# Patient Record
Sex: Male | Born: 1970 | Hispanic: No | Marital: Married | Smoking: Never smoker
Health system: Southern US, Community
[De-identification: ages and names within clinical notes are randomized; demographics above are authoritative.]

---

## 2014-09-09 ENCOUNTER — Other Ambulatory Visit (HOSPITAL_COMMUNITY): Payer: Self-pay | Admitting: Nurse Practitioner

## 2014-09-09 DIAGNOSIS — B181 Chronic viral hepatitis B without delta-agent: Secondary | ICD-10-CM

## 2014-09-10 ENCOUNTER — Other Ambulatory Visit (HOSPITAL_COMMUNITY): Payer: Self-pay | Admitting: Nurse Practitioner

## 2014-09-10 DIAGNOSIS — B181 Chronic viral hepatitis B without delta-agent: Secondary | ICD-10-CM

## 2014-09-24 ENCOUNTER — Ambulatory Visit (HOSPITAL_COMMUNITY)
Admission: RE | Admit: 2014-09-24 | Discharge: 2014-09-24 | Disposition: A | Discharge status: Home / Self Care | Payer: Managed Care, Other (non HMO) | Source: Ambulatory Visit | Attending: Nurse Practitioner | Admitting: Nurse Practitioner

## 2014-09-24 DIAGNOSIS — K760 Fatty (change of) liver, not elsewhere classified: Secondary | ICD-10-CM | POA: Diagnosis not present

## 2014-09-24 DIAGNOSIS — R934 Abnormal findings on diagnostic imaging of urinary organs: Secondary | ICD-10-CM | POA: Diagnosis not present

## 2014-09-24 DIAGNOSIS — B181 Chronic viral hepatitis B without delta-agent: Secondary | ICD-10-CM | POA: Diagnosis present

## 2014-09-30 ENCOUNTER — Other Ambulatory Visit (HOSPITAL_COMMUNITY): Payer: Self-pay | Admitting: Nurse Practitioner

## 2014-09-30 DIAGNOSIS — B181 Chronic viral hepatitis B without delta-agent: Secondary | ICD-10-CM

## 2014-10-02 ENCOUNTER — Ambulatory Visit (HOSPITAL_COMMUNITY): Payer: Managed Care, Other (non HMO)

## 2014-10-07 ENCOUNTER — Other Ambulatory Visit: Payer: Self-pay | Admitting: Family Medicine

## 2014-10-07 DIAGNOSIS — N289 Disorder of kidney and ureter, unspecified: Secondary | ICD-10-CM

## 2014-10-15 ENCOUNTER — Ambulatory Visit (HOSPITAL_COMMUNITY): Payer: Self-pay

## 2014-10-16 ENCOUNTER — Ambulatory Visit (HOSPITAL_COMMUNITY): Payer: Managed Care, Other (non HMO)

## 2014-10-29 ENCOUNTER — Ambulatory Visit
Admission: RE | Admit: 2014-10-29 | Discharge: 2014-10-29 | Disposition: A | Discharge status: Home / Self Care | Payer: Managed Care, Other (non HMO) | Source: Ambulatory Visit | Attending: Family Medicine | Admitting: Family Medicine

## 2014-10-29 DIAGNOSIS — N289 Disorder of kidney and ureter, unspecified: Secondary | ICD-10-CM

## 2014-10-29 MED ORDER — GADOBENATE DIMEGLUMINE 529 MG/ML IV SOLN
20.0000 mL | Freq: Once | INTRAVENOUS | Status: AC | PRN
  Administered 2014-10-29: 20 mL via INTRAVENOUS

## 2016-04-12 ENCOUNTER — Other Ambulatory Visit: Payer: Self-pay | Admitting: Family Medicine

## 2016-04-12 DIAGNOSIS — B199 Unspecified viral hepatitis without hepatic coma: Secondary | ICD-10-CM

## 2016-04-12 DIAGNOSIS — B178 Other specified acute viral hepatitis: Secondary | ICD-10-CM

## 2016-04-29 ENCOUNTER — Ambulatory Visit
Admission: RE | Admit: 2016-04-29 | Discharge: 2016-04-29 | Disposition: A | Discharge status: Home / Self Care | Payer: Managed Care, Other (non HMO) | Source: Ambulatory Visit | Attending: Family Medicine | Admitting: Family Medicine

## 2016-04-29 DIAGNOSIS — B178 Other specified acute viral hepatitis: Secondary | ICD-10-CM

## 2016-04-29 DIAGNOSIS — B199 Unspecified viral hepatitis without hepatic coma: Secondary | ICD-10-CM

## 2017-09-13 ENCOUNTER — Ambulatory Visit (HOSPITAL_COMMUNITY)
Admission: RE | Admit: 2017-09-13 | Discharge: 2017-09-13 | Disposition: A | Discharge status: Home / Self Care | Payer: 59 | Attending: Psychiatry | Admitting: Psychiatry

## 2017-09-13 DIAGNOSIS — Z1330 Encounter for screening examination for mental health and behavioral disorders, unspecified: Secondary | ICD-10-CM | POA: Diagnosis not present

## 2017-09-13 DIAGNOSIS — F411 Generalized anxiety disorder: Secondary | ICD-10-CM | POA: Insufficient documentation

## 2017-09-13 DIAGNOSIS — F4310 Post-traumatic stress disorder, unspecified: Secondary | ICD-10-CM | POA: Diagnosis not present

## 2017-09-13 DIAGNOSIS — G4700 Insomnia, unspecified: Secondary | ICD-10-CM | POA: Diagnosis not present

## 2017-09-13 NOTE — H&P (Signed)
Behavioral Health Medical Screening Exam  Mark Green is an 47 y.o. male patient presented to [facility] as walk in; brought by his wife and sent from his primary physician's office; with complaints of worsening anxiety and insomnia.  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  States that he has already set up a appointment with outpatient provider for therapy and psychiatry but his primary physician wanted him to "come her for evaluation."  Patient denies suicidal/homicidal/self-harm ideation, and psychosis.  Does endorse some paranoia about being alone in the dark but state that it is nothing new but the lack of sleep is getting worse which makes everything else worse.  His primary gave him a prescription for Ativan 1 mg and he could take once during the day and one at bedtime if needed.  Patient is able to contract for safety and wife at side also states that she feels that he is safe to go home.      Total Time spent with patient: 45 minutes  Psychiatric Specialty Exam: Physical Exam  Vitals reviewed. Constitutional: He is oriented to person, place, and time. He appears well-developed and well-nourished.  Neck: Normal range of motion.  Respiratory: Effort normal.  Musculoskeletal: Normal range of motion.  Neurological: He is alert and oriented to person, place, and time.  Skin: Skin is warm and dry.  Psychiatric: His speech is normal and behavior is normal. Judgment normal. His mood appears anxious. He is not actively hallucinating. Thought content is not paranoid and not delusional. Cognition and memory are normal. He expresses no homicidal and no suicidal ideation.    Review of Systems  Psychiatric/Behavioral: Negative for depression, hallucinations, memory loss, substance abuse and suicidal ideas. The patient is nervous/anxious and  has insomnia.   All other systems reviewed and are negative.   Blood pressure (!) 146/102, pulse 79, temperature (!) 97.5 F (36.4 C), resp. rate 18, SpO2 100 %.There is no height or weight on file to calculate BMI.  General Appearance: Casual and Neat  Eye Contact:  Good  Speech:  Clear and Coherent and Normal Rate  Volume:  Normal  Mood:  Anxious  Affect:  Appropriate and Congruent  Thought Process:  Coherent and Goal Directed  Orientation:  Full (Time, Place, and Person)  Thought Content:  Logical  Suicidal Thoughts:  No  Homicidal Thoughts:  No  Memory:  Immediate;   Good Recent;   Good Remote;   Good  Judgement:  Intact  Insight:  Present  Psychomotor Activity:  Normal  Concentration: Concentration: Good and Attention Span: Good  Recall:  Good  Fund of Knowledge:Good  Language: Good  Akathisia:  No  Handed:  Right  AIMS (if indicated):     Assets:  Communication Skills Desire for Improvement Financial Resources/Insurance Housing Physical Health Social Support Transportation  Sleep:       Musculoskeletal: Strength & Muscle Tone: within normal limits Gait & Station: normal Patient leans: N/A  Blood pressure (!) 146/102, pulse 79, temperature (!) 97.5 F (36.4 C), resp. rate 18, SpO2 100 %.   During evaluation Mark Green is alert/oriented x 4; calm/cooperative with pleasant affect.  He does not appear to be responding to internal/external stimuli or delusional thoughts.  Patient denies suicidal/self-harm/homicidal ideation, psychosis, and paranoia.  Patient answered question appropriately.  Recommendations:  Keep scheduled appointment with psychiatric outpatient services; Follow instruction of primary provider for prescribed Ativan; Return [facility] or ED if symptoms worsen or if suicidal ideation.  Based on my evaluation the patient does not appear to have an emergency medical condition.  Shuvon Rankin, NP 09/13/2017, 12:38 PM

## 2017-09-13 NOTE — BH Assessment (Signed)
Assessment Note  Mark Green is an 47 y.o. married  male who presents to [facility] for walk-in assessment. Pt was given rx by primary care MD for 1mg  ativan x 15 to get pt through to his 1st appt with psychiatrist, Alanson Aly next week. Pt also has a 2nd appt with therapist, [name] in 2 days. Pt here for assessment to r/o danger to self or others in meantime. Pt denies SI, HI and AVH. He reports no hx of SI or HI. He reports trauma in childhood and that he saw a psychiatrist at 47 yo. Pt remembers testing with psychiatrist and not much f/u at that time. He reports long hx of anxiety. He states he has a phobia of being alone in the dark. Pt reports significant difficulty sleeping x "at least several months". Pt advised to keep his upcoming MH appointments and to return to [facility] if he has any thoughts to harm himself or others.   Diagnosis: F41.1 Generalized Anxiety Disorder; Insomnia  Disposition: Pt to f/u with scheduled Outpt Tx appointments recommended by Assunta Found, NP  Past Medical History: No past medical history on file.    Family History: No family history on file.  Social History:  has no tobacco, alcohol, and drug history on file.  Additional Social History:  Alcohol / Drug Use Pain Medications: n/a Prescriptions: ativan1mg  rx given to pt today Over the Counter: n/a History of alcohol / drug use?: No history of alcohol / drug abuse  CIWA: CIWA-Ar BP: (!) 146/102 Pulse Rate: 79 COWS:    Allergies: Allergies not on file  Home Medications:  (Not in a hospital admission)  OB/GYN Status:  No LMP for male patient.  General Assessment Data Location of Assessment: [facility] Assessment Services TTS Assessment: In system Is this a Tele or Face-to-Face Assessment?: Face-to-Face Is this an Initial Assessment or a Re-assessment for this encounter?: Initial Assessment Marital status: Married Living Arrangements: Spouse/significant other, Children Can pt return to current living  arrangement?: Yes Admission Status: Voluntary Is patient capable of signing voluntary admission?: Yes Referral Source: MD Insurance type: Product/process development scientist Exam [facility] Walk-in ONLY) Medical Exam completed: Yes  Crisis Care Plan Living Arrangements: Spouse/significant other, Children Name of Psychiatrist: Dr. [name](1st appt scheduled for next week) Name of Therapist: Nadine Counts [place](seen 1x)  Education Status Is patient currently in school?: No  Risk to self with the past 6 months Suicidal Ideation: No Has patient been a risk to self within the past 6 months prior to admission? : No Suicidal Intent: No Has patient had any suicidal intent within the past 6 months prior to admission? : No Is patient at risk for suicide?: No Suicidal Plan?: No Has patient had any suicidal plan within the past 6 months prior to admission? : No What has been your use of drugs/alcohol within the last 12 months?: denies Previous Attempts/Gestures: No Other Self Harm Risks: none Triggers for Past Attempts: None known Intentional Self Injurious Behavior: None Family Suicide History: No Recent stressful life event(s): Other (Comment)(childhood trauma) Persecutory voices/beliefs?: No Depression: No(not diagnosed) Depression Symptoms: Insomnia, Feeling angry/irritable, Fatigue, Loss of interest in usual pleasures Substance abuse history and/or treatment for substance abuse?: No Suicide prevention information given to non-admitted patients: Yes  Risk to Others within the past 6 months Homicidal Ideation: No Does patient have any lifetime risk of violence toward others beyond the six months prior to admission? : No Thoughts of Harm to Others: No Current Homicidal Intent: No  Current Homicidal Plan: No History of harm to others?: No Assessment of Violence: None Noted Violent Behavior Description: none known Criminal Charges Pending?: No Does patient have a court date: No Is patient on probation?:  No  Psychosis Hallucinations: None noted Delusions: None noted  Mental Status Report Appearance/Hygiene: Unremarkable Eye Contact: Good Motor Activity: Freedom of movement, Restlessness Speech: Logical/coherent, Pressured, Unremarkable Level of Consciousness: Alert Mood: Anxious Affect: Anxious, Blunted Anxiety Level: Moderate Thought Processes: Coherent, Relevant Judgement: Unimpaired Orientation: Person, Place, Time, Situation  Cognitive Functioning Concentration: Decreased Memory: Recent Intact, Remote Intact IQ: Average Insight: Good Impulse Control: Good Appetite: Fair Weight Loss: (intentionally has lost wt) Sleep: Decreased(been a problem for many months) Total Hours of Sleep: 4  ADLScreening [facility] Assessment [facility]) Patient's cognitive ability adequate to safely complete daily activities?: Yes Patient able to express need for assistance with ADLs?: Yes Independently performs ADLs?: Yes (appropriate for developmental age)  Prior Inpatient Therapy Prior Inpatient Therapy: No  Prior Outpatient Therapy Does patient have an ACCT team?: No Does patient have Intensive In-House Services?  : No Does patient have [facility] services? : No Does patient have P4CC services?: No  ADL Screening (condition at time of admission) Patient's cognitive ability adequate to safely complete daily activities?: Yes Is the patient deaf or have difficulty hearing?: No Does the patient have difficulty seeing, even when wearing glasses/contacts?: No Does the patient have difficulty concentrating, remembering, or making decisions?: No Patient able to express need for assistance with ADLs?: Yes Does the patient have difficulty dressing or bathing?: No Independently performs ADLs?: Yes (appropriate for developmental age) Does the patient have difficulty walking or climbing stairs?: No Weakness of Legs: None Weakness of Arms/Hands: None  Home Assistive Devices/Equipment Home Assistive  Devices/Equipment: Eyeglasses  Therapy Consults (therapy consults require a physician order) PT Evaluation Needed: No OT Evalulation Needed: No SLP Evaluation Needed: No Abuse/Neglect Assessment (Assessment to be complete while patient is alone) Abuse/Neglect Assessment Can Be Completed: Yes Physical Abuse: Denies Verbal Abuse: Yes, past (Comment) Sexual Abuse: Denies Exploitation of patient/patient's resources: Denies Self-Neglect: Denies Values / Beliefs Cultural Requests During Hospitalization: None Spiritual Requests During Hospitalization: None Consults Spiritual Care Consult Needed: No Social Work Consult Needed: No      Additional Information 1:1 In Past 12 Months?: No CIRT Risk: No Elopement Risk: No Does patient have medical clearance?: Yes     Disposition:  Disposition Initial Assessment Completed for this Encounter: Yes Disposition of Patient: Outpatient treatment(Pt has therapist appt 2/22; new psychiatrist appt next week)  On Site Evaluation by:   Reviewed with Physician:    Clearnce Sorreleirdre H Oshea Percival 09/13/2017 12:52 PM

## 2018-04-17 DIAGNOSIS — F411 Generalized anxiety disorder: Secondary | ICD-10-CM | POA: Insufficient documentation

## 2018-04-17 DIAGNOSIS — F429 Obsessive-compulsive disorder, unspecified: Secondary | ICD-10-CM | POA: Insufficient documentation

## 2018-04-17 DIAGNOSIS — G4700 Insomnia, unspecified: Secondary | ICD-10-CM | POA: Insufficient documentation

## 2018-05-04 ENCOUNTER — Encounter: Payer: Self-pay | Admitting: Psychiatry

## 2018-05-04 ENCOUNTER — Encounter: Payer: 59 | Admitting: Psychiatry

## 2018-05-04 NOTE — Progress Notes (Signed)
Note opened in error.  This encounter was created in error - please disregard. 

## 2018-06-08 ENCOUNTER — Other Ambulatory Visit: Payer: Self-pay | Admitting: Psychiatry

## 2018-06-08 NOTE — Telephone Encounter (Signed)
Need to review chart  

## 2018-06-26 ENCOUNTER — Other Ambulatory Visit: Payer: Self-pay

## 2018-08-14 ENCOUNTER — Ambulatory Visit: Payer: 59 | Admitting: Psychiatry

## 2018-08-27 ENCOUNTER — Ambulatory Visit: Payer: 59 | Admitting: Psychiatry

## 2018-08-31 ENCOUNTER — Ambulatory Visit: Payer: 59 | Admitting: Psychiatry

## 2018-08-31 ENCOUNTER — Ambulatory Visit (INDEPENDENT_AMBULATORY_CARE_PROVIDER_SITE_OTHER): Payer: 59 | Admitting: Psychiatry

## 2018-08-31 ENCOUNTER — Encounter: Payer: Self-pay | Admitting: Psychiatry

## 2018-08-31 DIAGNOSIS — F902 Attention-deficit hyperactivity disorder, combined type: Secondary | ICD-10-CM | POA: Diagnosis not present

## 2018-08-31 DIAGNOSIS — F5101 Primary insomnia: Secondary | ICD-10-CM | POA: Diagnosis not present

## 2018-08-31 DIAGNOSIS — F422 Mixed obsessional thoughts and acts: Secondary | ICD-10-CM

## 2018-08-31 MED ORDER — LORAZEPAM 1 MG PO TABS: 1.0000 mg | ORAL_TABLET | Freq: Every evening | ORAL | 1 refills | Status: DC | PRN

## 2018-08-31 MED ORDER — GUANFACINE HCL 1 MG PO TABS: 1.0000 mg | ORAL_TABLET | Freq: Every day | ORAL | 1 refills | Status: DC

## 2018-08-31 MED ORDER — METHYLPHENIDATE HCL ER (OSM) 27 MG PO TBCR: 27.0000 mg | EXTENDED_RELEASE_TABLET | ORAL | 0 refills | Status: DC

## 2018-08-31 NOTE — Progress Notes (Signed)
Mark Green [id number] 1970/12/22 48 y.o.  Subjective:   Patient ID:  Mark Green is a 48 y.o. (DOB 1971/07/21) male.  Chief Complaint:  Chief Complaint  Patient presents with  . Follow-up    Medication Management   Last seen in July 2019 HPI [facility] [name] presents to the office today for follow-up of OCD  He increased fluvoxamine to 300 mg since here and maintained the dosage .  He doesn't think there's been a lot of improvement with the increase in fluvoxamine.  He wants to just cut the dosage back.  Still has OCD and anxiety.  Has had discussions with father.  He thinks he has ADHD but has ignored it bc anxiety was more debilitating.  Thinks it causes anxiety.  Talked with father about it.  F thinks he's had untreated ADHD.  Very impulsive still.  Cuts people off in conversation.  Won't pay attention if not interested.  Impulsivity in decisions trying to move quickly.  Novelty seeking to help motivation.  Reduced attention and impulsivity is longterm.  He thinks Add worsens anxiety which worsens the OCD.  Trying CBT himself which helps.  He doesn't think fluvoxamine does anything really.  He's done a lot of research and wants to use low dose stimulant and low dose non stimulant to help ADD and impuslivity without increaseing the anxiety. Past Psychiatric Medication Trials: Zoloft 2 days with anxiety, paroxetine 60 sexual SE,    Review of Systems:  Review of Systems  Respiratory: Positive for shortness of breath.        SOB long term and runs in the family  Cardiovascular: Negative for chest pain and palpitations.  Neurological: Negative for tremors and weakness.  Psychiatric/Behavioral: Positive for decreased concentration. Negative for agitation, behavioral problems, confusion, dysphoric mood, hallucinations, self-injury, sleep disturbance and suicidal ideas. The patient is nervous/anxious. The patient is not hyperactive.     Medications: I have  reviewed the patient's current medications.  Current Outpatient Medications  Medication Sig Dispense Refill  . diphenhydrAMINE (BENADRYL) 25 MG tablet Take by mouth.    . fluvoxaMINE (LUVOX) 100 MG tablet TAKE 3 TABLETS BY MOUTH EVERY DAY 90 tablet 0  . lisinopril (PRINIVIL,ZESTRIL) 10 MG tablet     . LORazepam (ATIVAN) 1 MG tablet Take 1 tablet (1 mg total) by mouth at bedtime as needed for anxiety. 30 tablet 1  . Multiple Vitamin (MULTIVITAMIN) capsule Take by mouth.    . Omega-3 Fatty Acids (FISH OIL) 1000 MG CAPS Take 2,000 mg by mouth daily.    . simvastatin (ZOCOR) 20 MG tablet     . guanFACINE (TENEX) 1 MG tablet Take 1 tablet (1 mg total) by mouth at bedtime. 30 tablet 1  . methylphenidate (CONCERTA) 27 MG PO CR tablet Take 1 tablet (27 mg total) by mouth every morning. 30 tablet 0   No current facility-administered medications for this visit.     Medication Side Effects: Other: weight gain  Allergies: No Known Allergies  History reviewed. No pertinent past medical history.  Family History  Problem Relation Age of Onset  . OCD Mother   . Anxiety disorder Father     Social History   Socioeconomic History  . Marital status: Married    Spouse name: Not on file  . Number of children: Not on file  . Years of education: Not on file  . Highest education level: Not on file  Occupational History  . Not on file  Social Needs  .  Financial resource strain: Not on file  . Food insecurity:    Worry: Not on file    Inability: Not on file  . Transportation needs:    Medical: Not on file    Non-medical: Not on file  Tobacco Use  . Smoking status: Never Smoker  . Smokeless tobacco: Never Used  Substance and Sexual Activity  . Alcohol use: Not Currently  . Drug use: Never  . Sexual activity: Yes    Partners: Female    Birth control/protection: None  Lifestyle  . Physical activity:    Days per week: Not on file    Minutes per session: Not on file  . Stress: Not on file   Relationships  . Social connections:    Talks on phone: Not on file    Gets together: Not on file    Attends religious service: Not on file    Active member of club or organization: Not on file    Attends meetings of clubs or organizations: Not on file    Relationship status: Not on file  . Intimate partner violence:    Fear of current or ex partner: Not on file    Emotionally abused: Not on file    Physically abused: Not on file    Forced sexual activity: Not on file  Other Topics Concern  . Not on file  Social History Narrative  . Not on file    Past Medical History, Surgical history, Social history, and Family history were reviewed and updated as appropriate.   Adult Self Report Scale Inattention=24 and hyperactivity=30  Total 54 which is very high  Please see review of systems for further details on the patient's review from today.   Objective:   Physical Exam:  There were no vitals taken for this visit.  Physical Exam Constitutional:      General: He is not in acute distress.    Appearance: He is well-developed.  Musculoskeletal:        General: No deformity.  Neurological:     Mental Status: He is alert and oriented to person, place, and time.     Motor: No tremor.     Coordination: Coordination normal.     Gait: Gait normal.  Psychiatric:        Attention and Perception: Perception normal. He is inattentive.        Mood and Affect: Mood is anxious. Mood is not depressed. Affect is not labile, blunt, angry or inappropriate.        Speech: Speech normal.        Behavior: Behavior normal.        Thought Content: Thought content normal. Thought content does not include homicidal or suicidal ideation. Thought content does not include homicidal or suicidal plan.        Cognition and Memory: Cognition normal.        Judgment: Judgment normal.     Comments: Insight is good. Residual OCD unchanged.     Lab Review:  No results found for: NA, K, CL, CO2, GLUCOSE,  BUN, CREATININE, CALCIUM, PROT, ALBUMIN, AST, ALT, ALKPHOS, BILITOT, GFRNONAA, GFRAA  No results found for: WBC, RBC, HGB, HCT, PLT, MCV, MCH, MCHC, RDW, LYMPHSABS, MONOABS, EOSABS, BASOSABS  No results found for: POCLITH, LITHIUM   No results found for: PHENYTOIN, PHENOBARB, VALPROATE, CBMZ   .res Assessment: Plan:    Attention deficit hyperactivity disorder (ADHD), combined type - Plan: guanFACINE (TENEX) 1 MG tablet, methylphenidate (CONCERTA) 27 MG PO CR  tablet  Mixed obsessional thoughts and acts  Primary insomnia   I accept  the patient's self-assessment that he has significant untreated ADHD combined type.  His hyperactivity score on the adult self-report scale was much higher than what is typically seen with adult patients.  Therefore it is reasonable to approach it as he suggests with low-dose stimulant plus guanfacine.  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  He brought in a paper about the comorbidity between ADHD and OCD.  We discussed this.  We also discussed that stimulants can worsen anxiety.  We will proceed with a low-dose.  We discussed the different types of stimulants and durations and formulations and side effects in detail.  Plan reduce fluvoxamine to 200 mg and consider change to fluoxetine or clomipramine to get better control the OCD.  I strongly encouraged trials of other SSRIs to better control his OCD and anxiety overall.  He thinks improving his ADD symptoms will improve his anxiety.  Per his request OK trial stimulant with low dosage guanfacine.  Concerta 27 mg and guanfacine 0.5 mg for 1 week and then 1 mg nightly. Discussed potential benefits, risks, and side effects of stimulants with patient to include increased heart rate, palpitations, insomnia, increased anxiety, increased irritability, or decreased appetite.  Instructed patient to contact office if experiencing any significant tolerability issues.  This is a 35-minute  appointment  Follow-up 6 to 8 weeks  Meredith Staggersarey Cottle, MD, DFAPA  Please see After Visit Summary for patient specific instructions.  Future Appointments  Date Time Provider Department Center  10/19/2018  9:30 AM Cottle, Steva Readyarey G Jr., MD [facility] None    No orders of the defined types were placed in this encounter.     -------------------------------

## 2018-09-18 ENCOUNTER — Other Ambulatory Visit: Payer: Self-pay | Admitting: Psychiatry

## 2018-09-19 ENCOUNTER — Other Ambulatory Visit: Payer: Self-pay

## 2018-09-19 MED ORDER — FLUVOXAMINE MALEATE 100 MG PO TABS: ORAL_TABLET | ORAL | 0 refills | Status: DC

## 2018-09-22 ENCOUNTER — Other Ambulatory Visit: Payer: Self-pay | Admitting: Psychiatry

## 2018-09-22 DIAGNOSIS — F902 Attention-deficit hyperactivity disorder, combined type: Secondary | ICD-10-CM

## 2018-10-18 ENCOUNTER — Other Ambulatory Visit: Payer: Self-pay | Admitting: Psychiatry

## 2018-10-18 DIAGNOSIS — F902 Attention-deficit hyperactivity disorder, combined type: Secondary | ICD-10-CM

## 2018-10-18 NOTE — Telephone Encounter (Signed)
Has office visit tomorrow

## 2018-10-19 ENCOUNTER — Other Ambulatory Visit: Payer: Self-pay

## 2018-10-19 ENCOUNTER — Ambulatory Visit (INDEPENDENT_AMBULATORY_CARE_PROVIDER_SITE_OTHER): Payer: 59 | Admitting: Psychiatry

## 2018-10-19 ENCOUNTER — Encounter: Payer: Self-pay | Admitting: Psychiatry

## 2018-10-19 DIAGNOSIS — F5101 Primary insomnia: Secondary | ICD-10-CM

## 2018-10-19 DIAGNOSIS — F902 Attention-deficit hyperactivity disorder, combined type: Secondary | ICD-10-CM | POA: Diagnosis not present

## 2018-10-19 DIAGNOSIS — F422 Mixed obsessional thoughts and acts: Secondary | ICD-10-CM

## 2018-10-19 MED ORDER — ALPRAZOLAM 0.5 MG PO TABS: ORAL_TABLET | ORAL | 1 refills | Status: DC

## 2018-10-19 MED ORDER — METHYLPHENIDATE HCL ER (OSM) 36 MG PO TBCR: 36.0000 mg | EXTENDED_RELEASE_TABLET | Freq: Every day | ORAL | 0 refills | Status: DC

## 2018-10-19 MED ORDER — GUANFACINE HCL 1 MG PO TABS: ORAL_TABLET | ORAL | 0 refills | Status: DC

## 2018-10-19 MED ORDER — GUANFACINE HCL 1 MG PO TABS: ORAL_TABLET | ORAL | 1 refills | Status: DC

## 2018-10-19 NOTE — Telephone Encounter (Signed)
rx needs to be 90 day for insurance to cover guanfacine 1mg  submitted for #[id number]

## 2018-10-19 NOTE — Progress Notes (Addendum)
Mark Green [id number] 1971/05/03 48 y.o.  Subjective:   Patient ID:  Mark Green is a 48 y.o. (DOB [birth year]) male.  Chief Complaint:  Chief Complaint  Patient presents with  . ADHD  . Anxiety    OCD  . Follow-up    med changes  . Sleeping Problem    HPI [facility] [name] presents to the office today for follow-up of OCD and ADD.  He was last seen August 31, 2018 his OCD and ADD symptoms were not under as good control as he would like.  His preference was to focus on the ADD symptoms and he started Concerta 27 mg every morning and guanfacine 0.5 mg for 1 week to increase to 1 mg nightly.  Also reduced the fluvoxamine to .  Well, but a little more anxiety but under more stress with Covid and working from home.  Concerta inititally better focus.  Maybe a little less now re: benefit.  Doesn't think it increased anxiety.  Skipped for 2-3 days but felt no withdrawal.  Only taking Ativan at hs.  Some sleep problems and wonders about switch back to alprazolam..  Disc his questions.   He increased fluvoxamine to 300 mg since here and maintained the dosage .  He doesn't think there's been a lot of improvement with the increase in fluvoxamine.  He wants to just cut the dosage back.  Still has OCD and anxiety.  Has had discussions with father.  He thinks he has ADHD but has ignored it bc anxiety was more debilitating.  Thinks it causes anxiety.  Talked with father about it.  F thinks he's had untreated ADHD.  Very impulsive still.  Cuts people off in conversation.  Won't pay attention if not interested.  Impulsivity in decisions trying to move quickly.  Novelty seeking to help motivation.  Reduced attention and impulsivity is longterm.  He thinks Add worsens anxiety which worsens the OCD.  Trying CBT himself which helps.  He doesn't think fluvoxamine does anything really.  He's done a lot of research and wants to use low dose stimulant and low dose non stimulant to  help ADD and impuslivity without increaseing the anxiety. Past Psychiatric Medication Trials: Zoloft 2 days with anxiety, paroxetine 60 sexual SE, trazodone.   Review of Systems:  Review of Systems  Respiratory: Negative for shortness of breath.        SOB long term and runs in the family  Cardiovascular: Negative for chest pain and palpitations.  Neurological: Negative for tremors and weakness.  Psychiatric/Behavioral: Positive for decreased concentration. Negative for agitation, behavioral problems, confusion, dysphoric mood, hallucinations, self-injury, sleep disturbance and suicidal ideas. The patient is nervous/anxious. The patient is not hyperactive.     Medications: I have reviewed the patient's current medications.  Current Outpatient Medications  Medication Sig Dispense Refill  . ALPRAZolam (XANAX) 0.5 MG tablet 1 to 1-1/2 tablets each night as needed for sleep 45 tablet 1  . diphenhydrAMINE (BENADRYL) 25 MG tablet Take by mouth.    . fluvoxaMINE (LUVOX) 100 MG tablet Take 2 tablets (200 mg total) by mouth daily 180 tablet 0  . guanFACINE (TENEX) 1 MG tablet 2 tablets each night for 1 week then 3 tablets at night 270 tablet 0  . lisinopril (PRINIVIL,ZESTRIL) 10 MG tablet     . methylphenidate (CONCERTA) 36 MG PO CR tablet Take 1 tablet (36 mg total) by mouth daily. 30 tablet 0  . Multiple Vitamin (MULTIVITAMIN) capsule Take by mouth.    Marland Kitchen  Omega-3 Fatty Acids (FISH OIL) 1000 MG CAPS Take 2,000 mg by mouth daily.    . simvastatin (ZOCOR) 20 MG tablet      No current facility-administered medications for this visit.     Medication Side Effects: Other: weight gain  Allergies: No Known Allergies  History reviewed. No pertinent past medical history.  Family History  Problem Relation Age of Onset  . OCD Mother   . Anxiety disorder Father     Social History   Socioeconomic History  . Marital status: Married    Spouse name: Not on file  . Number of children: Not on  file  . Years of education: Not on file  . Highest education level: Not on file  Occupational History  . Not on file  Social Needs  . Financial resource strain: Not on file  . Food insecurity:    Worry: Not on file    Inability: Not on file  . Transportation needs:    Medical: Not on file    Non-medical: Not on file  Tobacco Use  . Smoking status: Never Smoker  . Smokeless tobacco: Never Used  Substance and Sexual Activity  . Alcohol use: Not Currently  . Drug use: Never  . Sexual activity: Yes    Partners: Female    Birth control/protection: None  Lifestyle  . Physical activity:    Days per week: Not on file    Minutes per session: Not on file  . Stress: Not on file  Relationships  . Social connections:    Talks on phone: Not on file    Gets together: Not on file    Attends religious service: Not on file    Active member of club or organization: Not on file    Attends meetings of clubs or organizations: Not on file    Relationship status: Not on file  . Intimate partner violence:    Fear of current or ex partner: Not on file    Emotionally abused: Not on file    Physically abused: Not on file    Forced sexual activity: Not on file  Other Topics Concern  . Not on file  Social History Narrative  . Not on file    Past Medical History, Surgical history, Social history, and Family history were reviewed and updated as appropriate.   Adult Self Report Scale Inattention=24 and hyperactivity=30  Total 54 which is very high  Please see review of systems for further details on the patient's review from today.   Objective:   Physical Exam:  There were no vitals taken for this visit.  Physical Exam Constitutional:      General: He is not in acute distress.    Appearance: He is well-developed.  Musculoskeletal:        General: No deformity.  Neurological:     Mental Status: He is alert and oriented to person, place, and time.  Psychiatric:        Attention and  Perception: Perception normal. He is inattentive.        Mood and Affect: Mood is anxious. Mood is not depressed. Affect is not labile, blunt, angry or inappropriate.        Speech: Speech normal.        Behavior: Behavior normal.        Thought Content: Thought content normal. Thought content does not include homicidal or suicidal ideation.        Cognition and Memory: Cognition normal.  Judgment: Judgment normal.     Comments: Insight is good. Residual OCD unchanged.     Lab Review:  No results found for: NA, K, CL, CO2, GLUCOSE, BUN, CREATININE, CALCIUM, PROT, ALBUMIN, AST, ALT, ALKPHOS, BILITOT, GFRNONAA, GFRAA  No results found for: WBC, RBC, HGB, HCT, PLT, MCV, MCH, MCHC, RDW, LYMPHSABS, MONOABS, EOSABS, BASOSABS  No results found for: POCLITH, LITHIUM   No results found for: PHENYTOIN, PHENOBARB, VALPROATE, CBMZ   .res Assessment: Plan:    Attention deficit hyperactivity disorder (ADHD), combined type  Mixed obsessional thoughts and acts  Primary insomnia   I accept  the patient's self-assessment that he has significant untreated ADHD combined type.  His hyperactivity score on the adult self-report scale was much higher than what is typically seen with adult patients.  Therefore it is reasonable to approach it as he suggests with low-dose stimulant plus guanfacine.  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  .  We also discussed that stimulants can worsen anxiety.  We will proceed with a low-dose.  We discussed the different types of stimulants and durations and formulations and side effects in detail.  Cconsider change to fluoxetine or clomipramine to get better control the OCD.  I strongly encouraged trials of other SSRIs to better control his OCD and anxiety overall.  But I'm hesitant to make so many med changes at one time. He thinks improving his ADD symptoms will improve his anxiety.  Per his request OK trial stimulant with low dosage guanfacine.   Concerta increase to 36 mg and guanfacine to 2 mg and then 3 mg nightly.  Discussed potential benefits, risks, and side effects of stimulants with patient to include increased heart rate, palpitations, insomnia, increased anxiety, increased irritability, or decreased appetite.  Instructed patient to contact office if experiencing any significant tolerability issues.  OK switch back to Xanax for sleep instead of Ativan. We discussed the short-term risks associated with benzodiazepines including sedation and increased fall risk among others.  Discussed long-term side effect risk including dependence, potential withdrawal symptoms, and the potential eventual dose-related risk of dementia.  This is a 35-minute appointment  I connected with patient by a video enabled telemedicine application or telephone, with their informed consent, and verified patient privacy and that I am speaking with the correct person using two identifiers.  I was located at office and patient at home.  Follow-up 6 weeks  Meredith Staggers, MD, DFAPA  Please see After Visit Summary for patient specific instructions.  No future appointments.  No orders of the defined types were placed in this encounter.     -------------------------------

## 2018-11-15 NOTE — Progress Notes (Signed)
Thanks. I did so. Meredith Staggers

## 2018-12-18 ENCOUNTER — Other Ambulatory Visit: Payer: Self-pay | Admitting: Psychiatry

## 2019-01-11 ENCOUNTER — Other Ambulatory Visit: Payer: Self-pay | Admitting: Psychiatry

## 2019-01-18 ENCOUNTER — Encounter: Payer: Self-pay | Admitting: Psychiatry

## 2019-01-18 ENCOUNTER — Other Ambulatory Visit: Payer: Self-pay

## 2019-01-18 ENCOUNTER — Ambulatory Visit (INDEPENDENT_AMBULATORY_CARE_PROVIDER_SITE_OTHER): Payer: 59 | Admitting: Psychiatry

## 2019-01-18 DIAGNOSIS — F5101 Primary insomnia: Secondary | ICD-10-CM | POA: Diagnosis not present

## 2019-01-18 DIAGNOSIS — F422 Mixed obsessional thoughts and acts: Secondary | ICD-10-CM

## 2019-01-18 DIAGNOSIS — F902 Attention-deficit hyperactivity disorder, combined type: Secondary | ICD-10-CM | POA: Diagnosis not present

## 2019-01-18 MED ORDER — GUANFACINE HCL 1 MG PO TABS: ORAL_TABLET | ORAL | 0 refills | Status: DC

## 2019-01-18 MED ORDER — METHYLPHENIDATE HCL ER (OSM) 36 MG PO TBCR: 36.0000 mg | EXTENDED_RELEASE_TABLET | Freq: Every day | ORAL | 0 refills | Status: DC

## 2019-01-18 NOTE — Progress Notes (Signed)
Mark Green [id number] 1970/07/27 48 y.o.  Subjective:   Patient ID:  Mark Green is a 48 y.o. (DOB [birth year]) male.  Chief Complaint:  Chief Complaint  Patient presents with  . Follow-up    Med changes  . ADHD  . Sleeping Problem  . Obsessive-compulsive disorder    HPI [facility] Mark Green presents to the office today for follow-up of OCD and ADD and insomnia.  Last visit was October 19, 2018.  Concerta was increased to 36 mg and guanfacine was increased to 2 and then 3 mg.  Also for sleep Ativan was switched back to Xanax because he felt it worked better for sleep.  .Some improvement from Guanfacine to 2 mg .  Concerta helped some benefits with better focus and then ran out and didn't renew bc fears about it being a controlled substance.  No history of abuse.  Was surprised he felt less stressed on it also.    He does a lot of online research on the Internet for things.  Questions about NAC for Covid and focus and OCD.  Thinks since starting it has helped.  Extensive discussion of NAC in cognitive issues and OC sx.   At visit August 31, 2018 his OCD and ADD symptoms were not under as good control as he would like.  His preference was to focus on the ADD symptoms and he started Concerta 27 mg every morning and guanfacine 0.5 mg for 1 week to increase to 1 mg nightly.  Also reduced the fluvoxamine to 200mg .  He's done a lot of research and wants to use low dose stimulant and low dose non stimulant to help ADD and impuslivity without increaseing the anxiety.  This has been partially beneficial.  Patient reports stable mood and denies depressed or irritable moods.    Patient reports some difficulty with sleep initiation or maintenance but better with guanfacine. Denies appetite disturbance.  Patient reports that energy and motivation have been good. Patient denies any suicidal ideation.   Past Psychiatric Medication Trials: Zoloft 2 days with anxiety, paroxetine 60  sexual SE, trazodone.   Review of Systems:  Review of Systems  Respiratory: Negative for shortness of breath.        SOB long term and runs in the family  Cardiovascular: Negative for chest pain and palpitations.  Neurological: Negative for tremors and weakness.  Psychiatric/Behavioral: Positive for decreased concentration. Negative for agitation, behavioral problems, confusion, dysphoric mood, hallucinations, self-injury, sleep disturbance and suicidal ideas. The patient is nervous/anxious. The patient is not hyperactive.     Medications: I have reviewed the patient's current medications.  Current Outpatient Medications  Medication Sig Dispense Refill  . ALPRAZolam (XANAX) 0.5 MG tablet 1 to 1-1/2 tablets each night as needed for sleep 45 tablet 1  . diphenhydrAMINE (BENADRYL) 25 MG tablet Take by mouth.    . fluvoxaMINE (LUVOX) 100 MG tablet TAKE 2 TABLETS BY MOUTH EVERY DAY 180 tablet 0  . guanFACINE (TENEX) 1 MG tablet TAKE 3 TABLETS NIGHTLY 270 tablet 0  . Multiple Vitamin (MULTIVITAMIN) capsule Take by mouth.    . Omega-3 Fatty Acids (FISH OIL) 1000 MG CAPS Take 2,000 mg by mouth daily.    . simvastatin (ZOCOR) 20 MG tablet     . lisinopril (PRINIVIL,ZESTRIL) 10 MG tablet     . methylphenidate (CONCERTA) 36 MG PO CR tablet Take 1 tablet (36 mg total) by mouth daily. 30 tablet 0  . [START ON 02/15/2019] methylphenidate (CONCERTA) 36 MG  PO CR tablet Take 1 tablet (36 mg total) by mouth daily. 30 tablet 0   No current facility-administered medications for this visit.     Medication Side Effects: Other: weight gain  Allergies: No Known Allergies  No past medical history on file.  Family History  Problem Relation Age of Onset  . OCD Mother   . Anxiety disorder Father     Social History   Socioeconomic History  . Marital status: Married    Spouse name: Not on file  . Number of children: Not on file  . Years of education: Not on file  . Highest education level: Not on  file  Occupational History  . Not on file  Social Needs  . Financial resource strain: Not on file  . Food insecurity    Worry: Not on file    Inability: Not on file  . Transportation needs    Medical: Not on file    Non-medical: Not on file  Tobacco Use  . Smoking status: Never Smoker  . Smokeless tobacco: Never Used  Substance and Sexual Activity  . Alcohol use: Not Currently  . Drug use: Never  . Sexual activity: Yes    Partners: Female    Birth control/protection: None  Lifestyle  . Physical activity    Days per week: Not on file    Minutes per session: Not on file  . Stress: Not on file  Relationships  . Social Musicianconnections    Talks on phone: Not on file    Gets together: Not on file    Attends religious service: Not on file    Active member of club or organization: Not on file    Attends meetings of clubs or organizations: Not on file    Relationship status: Not on file  . Intimate partner violence    Fear of current or ex partner: Not on file    Emotionally abused: Not on file    Physically abused: Not on file    Forced sexual activity: Not on file  Other Topics Concern  . Not on file  Social History Narrative  . Not on file    Past Medical History, Surgical history, Social history, and Family history were reviewed and updated as appropriate.   Adult Self Report Scale Inattention=24 and hyperactivity=30  Total 54 which is very high  Please see review of systems for further details on the patient's review from today.   Objective:   Physical Exam:  There were no vitals taken for this visit.  Physical Exam Constitutional:      General: He is not in acute distress.    Appearance: He is well-developed.  Musculoskeletal:        General: No deformity.  Neurological:     Mental Status: He is alert and oriented to person, place, and time.  Psychiatric:        Attention and Perception: Perception normal. He is inattentive.        Mood and Affect: Mood is  anxious. Mood is not depressed. Affect is not labile, blunt, angry or inappropriate.        Speech: Speech normal.        Behavior: Behavior normal.        Thought Content: Thought content normal. Thought content does not include homicidal or suicidal ideation.        Cognition and Memory: Cognition normal.        Judgment: Judgment normal.     Comments: Insight  is good. Residual OCD unchanged.     Lab Review:  No results found for: NA, K, CL, CO2, GLUCOSE, BUN, CREATININE, CALCIUM, PROT, ALBUMIN, AST, ALT, ALKPHOS, BILITOT, GFRNONAA, GFRAA  No results found for: WBC, RBC, HGB, HCT, PLT, MCV, MCH, MCHC, RDW, LYMPHSABS, MONOABS, EOSABS, BASOSABS  No results found for: POCLITH, LITHIUM   No results found for: PHENYTOIN, PHENOBARB, VALPROATE, CBMZ   .res Assessment: Plan:    Marnette Burgessziz was seen today for follow-up, adhd, sleeping problem and obsessive-compulsive disorder.  Diagnoses and all orders for this visit:  Mixed obsessional thoughts and acts  Attention deficit hyperactivity disorder (ADHD), combined type -     methylphenidate (CONCERTA) 36 MG PO CR tablet; Take 1 tablet (36 mg total) by mouth daily. -     guanFACINE (TENEX) 1 MG tablet; TAKE 3 TABLETS NIGHTLY -     methylphenidate (CONCERTA) 36 MG PO CR tablet; Take 1 tablet (36 mg total) by mouth daily.  Primary insomnia     I accept  the patient's self-assessment that he has significant untreated ADHD combined type.  His hyperactivity score on the adult self-report scale was much higher than what is typically seen with adult patients.  Therefore it is reasonable to approach it as he suggests with low-dose stimulant plus guanfacine.  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  .  We also discussed that stimulants can worsen anxiety.  We will proceed with a low-dose.  We discussed the different types of stimulants and durations and formulations and side effects in detail.  Cconsider change to fluoxetine or  clomipramine to get better control the OCD.  He feels the NAC 2000mg  daily has helped improve the OCD.  Consider trials of other SSRIs to better control his OCD and anxiety overall.  But I'm hesitant to make so many med changes at one time. He thinks improving his ADD symptoms will improve his anxiety.  Disc of pros and cons to restarting Concerta.  He wants to give it another try.  Per his request OK trial stimulant with low dosage guanfacine.  Concerta increase to 36 mg and guanfacine to 3 mg nightly.  Discussed potential benefits, risks, and side effects of stimulants with patient to include increased heart rate, palpitations, insomnia, increased anxiety, increased irritability, or decreased appetite.  Instructed patient to contact office if experiencing any significant tolerability issues.  OK continue Xanax prn for sleep instead of Ativan. We discussed the short-term risks associated with benzodiazepines including sedation and increased fall risk among others.  Discussed long-term side effect risk including dependence, potential withdrawal symptoms, and the potential eventual dose-related risk of dementia.  Answered questions and given some reassurance about dosage he's taking and what makes meds a controlled substance including the stimulant and the BZ.    Extensive discusssion of NAC off label for various conditions.    This is a 35-minute appointment  Follow-up 6-8 weeks  Meredith Staggersarey Cottle, MD, DFAPA  Please see After Visit Summary for patient specific instructions.  Future Appointments  Date Time Provider Department Center  03/29/2019  9:30 AM Cottle, Steva Readyarey G Jr., MD [facility] None    No orders of the defined types were placed in this encounter.     -------------------------------

## 2019-02-22 ENCOUNTER — Other Ambulatory Visit: Payer: Self-pay

## 2019-02-22 MED ORDER — ALPRAZOLAM 0.5 MG PO TABS: ORAL_TABLET | ORAL | 1 refills | Status: DC

## 2019-02-22 NOTE — Telephone Encounter (Signed)
rx accidentally printed but it was called into his pharmacy

## 2019-03-12 ENCOUNTER — Other Ambulatory Visit: Payer: Self-pay | Admitting: Psychiatry

## 2019-03-29 ENCOUNTER — Ambulatory Visit: Payer: 59 | Admitting: Psychiatry

## 2019-04-23 ENCOUNTER — Encounter: Payer: Self-pay | Admitting: Psychiatry

## 2019-04-23 ENCOUNTER — Ambulatory Visit (INDEPENDENT_AMBULATORY_CARE_PROVIDER_SITE_OTHER): Payer: 59 | Admitting: Psychiatry

## 2019-04-23 ENCOUNTER — Other Ambulatory Visit: Payer: Self-pay

## 2019-04-23 DIAGNOSIS — F902 Attention-deficit hyperactivity disorder, combined type: Secondary | ICD-10-CM

## 2019-04-23 DIAGNOSIS — F5101 Primary insomnia: Secondary | ICD-10-CM | POA: Diagnosis not present

## 2019-04-23 DIAGNOSIS — F422 Mixed obsessional thoughts and acts: Secondary | ICD-10-CM

## 2019-04-23 NOTE — Progress Notes (Signed)
Mark Green [id number] 12-Sep-1970 48 y.o.  Subjective:   Patient ID:  Mark Green is a 48 y.o. (DOB 12-Sep-1970) male.  Chief Complaint:  Chief Complaint  Patient presents with  . Medication Problem  . ADD  . Sleeping Problem    HPI Mark Green presents to the office today for follow-up of OCD and ADD and insomnia.  Last visit increased Concerta to 36 am and Guanfacine to 3mg .  But that made him sleepy.  He flet even the Concerta alone made  Him lethargic.  Lost benefit anyway. So stopped it.    Concern over the antidepressant potentially elevating glucose and stopped it 2-3 weeks ago.  Weaned himself off of it.  Doesn't think it did that much anyway.  No problems with it so far.  Changed Xanax for sleep instead of Ativan but usually uses about 1 1/2 tablets weekly.  Overall sleep is better.  Less work [employer] and feels more relaxed.  Feels more relaxed with his rest.  Covid has been great for me.  Thinks he over stresses himself.  Not working 1 week on an 1 week off probably until January.  Willing to take fluvoxamine again if the anxiety gets worst.  OCD sx are greatly reduced.  Overall   He does a lot of online research on the Internet for things.  Questions about NAC for Covid and focus and OCD.  Thinks since starting it has helped.  Extensive discussion of NAC in cognitive issues and OC sx.   At visit August 31, 2018 his OCD and ADD symptoms were not under as good control as he would like.  His preference was to focus on the ADD symptoms and he started Concerta 27 mg every morning and guanfacine 0.5 mg for 1 week to increase to 1 mg nightly.  Also reduced the fluvoxamine to 200mg .  He's done a lot of research and wants to use low dose stimulant and low dose non stimulant to help ADD and impuslivity without increaseing the anxiety.  This has been partially beneficial.  Patient reports stable mood and denies depressed or irritable moods.    Patient  reports some difficulty with sleep initiation or maintenance but better with guanfacine. Denies appetite disturbance.  Patient reports that energy and motivation have been good. Patient denies any suicidal ideation.   Past Psychiatric Medication Trials: Zoloft 2 days with anxiety, paroxetine 60 sexual SE, trazodone.   Review of Systems:  Review of Systems  Respiratory: Negative for shortness of breath.        SOB long term and runs in the family  Cardiovascular: Negative for chest pain and palpitations.  Neurological: Negative for tremors and weakness.  Psychiatric/Behavioral: Negative for agitation, behavioral problems, confusion, decreased concentration, dysphoric mood, hallucinations, self-injury, sleep disturbance and suicidal ideas. The patient is not nervous/anxious and is not hyperactive.     Medications: I have reviewed the patient's current medications.  Current Outpatient Medications  Medication Sig Dispense Refill  . ALPRAZolam (XANAX) 0.5 MG tablet 1 to 1-1/2 tablets each night as needed for sleep 45 tablet 1  . diphenhydrAMINE (BENADRYL) 25 MG tablet Take by mouth.    Marland Kitchen. lisinopril (PRINIVIL,ZESTRIL) 10 MG tablet     . Multiple Vitamin (MULTIVITAMIN) capsule Take by mouth.    . Omega-3 Fatty Acids (FISH OIL) 1000 MG CAPS Take 2,000 mg by mouth daily.    . simvastatin (ZOCOR) 20 MG tablet     . fluvoxaMINE (LUVOX) 100 MG  tablet TAKE 2 TABLETS BY MOUTH EVERY DAY (Patient not taking: Reported on 04/23/2019) 180 tablet 0  . guanFACINE (TENEX) 1 MG tablet TAKE 3 TABLETS NIGHTLY (Patient not taking: Reported on 04/23/2019) 270 tablet 0  . methylphenidate (CONCERTA) 36 MG PO CR tablet Take 1 tablet (36 mg total) by mouth daily. (Patient not taking: Reported on 04/23/2019) 30 tablet 0  . methylphenidate (CONCERTA) 36 MG PO CR tablet Take 1 tablet (36 mg total) by mouth daily. (Patient not taking: Reported on 04/23/2019) 30 tablet 0   No current facility-administered medications for  this visit.     Medication Side Effects: Other: weight gain  Allergies: No Known Allergies  History reviewed. No pertinent past medical history.  Family History  Problem Relation Age of Onset  . OCD Mother   . Anxiety disorder Father     Social History   Socioeconomic History  . Marital status: Married    Spouse name: Not on file  . Number of children: Not on file  . Years of education: Not on file  . Highest education level: Not on file  Occupational History  . Not on file  Social Needs  . Financial resource strain: Not on file  . Food insecurity    Worry: Not on file    Inability: Not on file  . Transportation needs    Medical: Not on file    Non-medical: Not on file  Tobacco Use  . Smoking status: Never Smoker  . Smokeless tobacco: Never Used  Substance and Sexual Activity  . Alcohol use: Not Currently  . Drug use: Never  . Sexual activity: Yes    Partners: Female    Birth control/protection: None  Lifestyle  . Physical activity    Days per week: Not on file    Minutes per session: Not on file  . Stress: Not on file  Relationships  . Social Herbalist on phone: Not on file    Gets together: Not on file    Attends religious service: Not on file    Active member of club or organization: Not on file    Attends meetings of clubs or organizations: Not on file    Relationship status: Not on file  . Intimate partner violence    Fear of current or ex partner: Not on file    Emotionally abused: Not on file    Physically abused: Not on file    Forced sexual activity: Not on file  Other Topics Concern  . Not on file  Social History Narrative  . Not on file    Past Medical History, Surgical history, Social history, and Family history were reviewed and updated as appropriate.   Adult Self Report Scale Inattention=24 and hyperactivity=30  Total 54 which is very high  Please see review of systems for further details on the patient's review from  today.   Objective:   Physical Exam:  There were no vitals taken for this visit.  Physical Exam Constitutional:      General: He is not in acute distress.    Appearance: He is well-developed.  Musculoskeletal:        General: No deformity.  Neurological:     Mental Status: He is alert and oriented to person, place, and time.  Psychiatric:        Attention and Perception: Perception normal. He is attentive.        Mood and Affect: Mood is not anxious or depressed. Affect  is not labile, blunt, angry or inappropriate.        Speech: Speech normal.        Behavior: Behavior normal.        Thought Content: Thought content normal. Thought content does not include homicidal or suicidal ideation.        Cognition and Memory: Cognition normal.        Judgment: Judgment normal.     Comments: Insight is good. Residual OCD unchanged.     Lab Review:  No results found for: NA, K, CL, CO2, GLUCOSE, BUN, CREATININE, CALCIUM, PROT, ALBUMIN, AST, ALT, ALKPHOS, BILITOT, GFRNONAA, GFRAA  No results found for: WBC, RBC, HGB, HCT, PLT, MCV, MCH, MCHC, RDW, LYMPHSABS, MONOABS, EOSABS, BASOSABS  No results found for: POCLITH, LITHIUM   No results found for: PHENYTOIN, PHENOBARB, VALPROATE, CBMZ   .res Assessment: Plan:    Kaesen was seen today for medication problem, add and sleeping problem.  Diagnoses and all orders for this visit:  Mixed obsessional thoughts and acts  Attention deficit hyperactivity disorder (ADHD), combined type  Primary insomnia     I accept  the patient's self-assessment that he has significant untreated ADHD combined type.  His hyperactivity score on the adult self-report scale was much higher than what is typically seen with adult patients.    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  Marland Kitchen He did not find the combination of stimulant plus guanfacine tolerable.  We discussed that guanfacine can be fatiguing and the problem could have been related to  that medication but he believes that the stimulant also made him lethargic.  Also he did not feel it was helpful enough to warrant continue taking it.  Philosophically he wants to be off SSRIs if possible as noted.  He feels the NAC 2000mg  daily has helped improve the OCD.  He stopped the fluvoxamine on his own about 2 to 3 weeks ago and so far has not noticed any worsening symptoms.  Disc SE in detail and SSRI withdrawal sx. he tapered himself off and did not see a problem.  Disc little evidence that SSRI increase glucose.  He feels his went up despite extra exercise, lower weight, better eating.  He's somewhat biased against them bc speculation about the the natural feedback mechanisms that happen with the use of SSRI.  Disc risk of relapse even up to 2-3 mos off the medication.    Extensive discussion of downstream effects of SSRI's and SE. again the patient is a and is interested in these discussions about how SSRIs can have downstream effects on dopamine and norepinephrine.  We discussed this in detail.  He still has asked considerable risk of relapse of OCD.  We discussed how there is a delayed onset of action but there is a delayed offset of action as well with SSRIs and he is still at risk of relapse.  He says he will contact [facility] if he has a relapse and is willing to take a lower dose if necessary of the fluvoxamine.  He is pleased with his overall response and feels that the lower levels of stress he is experiencing right now also contribute to lower levels of OCD.  OK continue Xanax prn for sleep instead of Ativan. He's not using much of them.  We discussed the short-term risks associated with benzodiazepines including sedation and increased fall risk among others.  Discussed long-term side effect risk including dependence, potential withdrawal symptoms, and  the potential eventual dose-related risk of dementia.  Answered questions and given some reassurance about dosage he's taking and  what makes meds a controlled substance including the stimulant and the BZ.    Extensive discusssion of NAC off label for various conditions.    This is a 35-minute appointment  Follow-up 4-6 mos.  He wants to keep his appointment spread out for financial reasons and says he will get in touch with [place] if he has a relapse.  Meredith Staggers, MD, DFAPA  Please see After Visit Summary for patient specific instructions.  No future appointments.  No orders of the defined types were placed in this encounter.     -------------------------------

## 2019-07-30 ENCOUNTER — Telehealth: Payer: Self-pay | Admitting: Psychiatry

## 2019-07-30 NOTE — Telephone Encounter (Signed)
Mark Green came by the office with a request for medication change.   He recently got laid off and this is going to be a hard time for him in the upcoming month. He would like to go back on the dosage we were using on Fluvoxamine (2-3 tablets a day) 100mg  as well as have Alprazolam 0.5.mg. He does not feel as bad as when he start treatment, but under these circumstances, he believes that this may be the best course of action. Next appt 10/18/19.  Please let him know if you agree. [phone]

## 2019-08-01 ENCOUNTER — Other Ambulatory Visit: Payer: Self-pay

## 2019-08-01 MED ORDER — ALPRAZOLAM 0.5 MG PO TABS: ORAL_TABLET | ORAL | 0 refills | Status: DC

## 2019-08-01 MED ORDER — FLUVOXAMINE MALEATE 100 MG PO TABS: 300.0000 mg | ORAL_TABLET | Freq: Every day | ORAL | 2 refills | Status: DC

## 2019-08-01 NOTE — Telephone Encounter (Signed)
Patient given instructions on restarting fluvoxamine, appreciative of the call. Informed him I would [facility] [place]. Advised him to call back if he needs anything else or has concerns.

## 2019-08-01 NOTE — Telephone Encounter (Signed)
I am fine with him returning to the fluvoxamine 100 mg tablets #3 daily, quantity #90, 2 refills as well as the alprazolam 0.5 mg 3 times daily as needed anxiety #60 no refills  He is likely to find the fluvoxamine initially a little sedating since he has been off of it for a while.  I would start at 1/2 tablet and increase every 4 to 5 days as tolerated up to a target of at least 200 mg daily.

## 2019-09-03 ENCOUNTER — Other Ambulatory Visit: Payer: Self-pay | Admitting: Psychiatry

## 2019-09-03 NOTE — Telephone Encounter (Signed)
Apt 03/26, 90 day okay?

## 2019-10-18 ENCOUNTER — Ambulatory Visit: Payer: 59 | Admitting: Psychiatry

## 2019-10-18 ENCOUNTER — Ambulatory Visit (INDEPENDENT_AMBULATORY_CARE_PROVIDER_SITE_OTHER): Payer: 59 | Admitting: Psychiatry

## 2019-10-18 ENCOUNTER — Other Ambulatory Visit: Payer: Self-pay

## 2019-10-18 ENCOUNTER — Encounter: Payer: Self-pay | Admitting: Psychiatry

## 2019-10-18 DIAGNOSIS — F902 Attention-deficit hyperactivity disorder, combined type: Secondary | ICD-10-CM | POA: Diagnosis not present

## 2019-10-18 DIAGNOSIS — F5101 Primary insomnia: Secondary | ICD-10-CM | POA: Diagnosis not present

## 2019-10-18 DIAGNOSIS — F422 Mixed obsessional thoughts and acts: Secondary | ICD-10-CM | POA: Diagnosis not present

## 2019-10-18 MED ORDER — MODAFINIL 200 MG PO TABS: 200.0000 mg | ORAL_TABLET | Freq: Every day | ORAL | 1 refills | Status: DC

## 2019-10-18 MED ORDER — QUETIAPINE FUMARATE 25 MG PO TABS: 25.0000 mg | ORAL_TABLET | Freq: Every day | ORAL | 1 refills | Status: DC

## 2019-10-18 NOTE — Progress Notes (Signed)
Mark Green [id number] 09-03-1970 49 y.o.  Subjective:   Patient ID:  Mark Green is a 49 y.o. (DOB 1970/09/23) male.  Chief Complaint:  Chief Complaint  Patient presents with  . Follow-up    Anxiety, ADD, sleep    HPI Mark Green presents to the office today for follow-up of OCD and ADD and insomnia.  At visit August 31, 2018 his OCD and ADD symptoms were not under as good control as he would like.  His preference was to focus on the ADD symptoms and he started Concerta 27 mg every morning and guanfacine 0.5 mg for 1 week to increase to 1 mg nightly.  Also reduced the fluvoxamine to 200mg .  Last seen September 2020. Changed Xanax for sleep instead of Ativan but usually uses about 1 1/2 tablets weekly.  Overall sleep is better.Concern over the antidepressant potentially elevating glucose and stopped it 2-3 weeks ago.  Weaned himself off of it.  Doesn't think it did that much anyway.  No problems with it so far. He's done a lot of research and wants to use low dose stimulant and low dose non stimulant to help ADD and impuslivity without increaseing the anxiety.  This has been partially beneficial.  Challenging 4 mos ago lost his job.  New job starts soon.  In January asked to restart fluvoxamine out of concern over the stress.  Been taking average for 1/2 alprazolam daily.  Anxiety better with job offer.  Had worried to the point he's tired of worrying and let it go.  Handled it better than in the past.  Not markedly depressed now.   SLeep is not that great.   F had sleep problems and Rx low dose Seroquel.  Wondered about it and took father's for 2 days.  Tried 1/2 of 25 mg Seroquel and it helped sleep. Had some hangover.   It worked better than 0.5 mg Xanax.  Patient reports stable mood and denies depressed or irritable moods.    Patient reports some difficulty with sleep initiation or maintenance but better with guanfacine. Denies appetite disturbance.   Patient reports that energy and motivation have been good. Patient denies any suicidal ideation.  Past Psychiatric Medication Trials: Zoloft 2 days with anxiety, paroxetine 60 sexual SE, trazodone. Concerta to 36 am and Guanfacine to 3mg .  But that made him sleepy.  He flet even the Concerta alone made  Him lethargic.  Lost benefit anyway.  Review of Systems:  Review of Systems  Respiratory: Negative for shortness of breath.        SOB long term and runs in the family  Cardiovascular: Negative for chest pain and palpitations.  Neurological: Negative for tremors and weakness.  Psychiatric/Behavioral: Negative for agitation, behavioral problems, confusion, decreased concentration, dysphoric mood, hallucinations, self-injury, sleep disturbance and suicidal ideas. The patient is not nervous/anxious and is not hyperactive.     Medications: I have reviewed the patient's current medications.  Current Outpatient Medications  Medication Sig Dispense Refill  . Acetylcysteine 600 MG CAPS Take 600 mg by mouth daily.    Marland Kitchen ALPRAZolam (XANAX) 0.5 MG tablet Take 1 tablet (0.5 mg) by mouth 3 times a day as needed for anxiety 60 tablet 0  . diphenhydrAMINE (BENADRYL) 25 MG tablet Take by mouth.    . fluvoxaMINE (LUVOX) 100 MG tablet TAKE 3 TABLETS BY MOUTH DAILY (Patient taking differently: Take 200 mg by mouth at bedtime. ) 270 tablet 0  . lisinopril (PRINIVIL,ZESTRIL) 10 MG tablet     .  Multiple Vitamin (MULTIVITAMIN) capsule Take by mouth.    . Omega-3 Fatty Acids (FISH OIL) 1000 MG CAPS Take 2,000 mg by mouth daily.    . simvastatin (ZOCOR) 20 MG tablet     . guanFACINE (TENEX) 1 MG tablet TAKE 3 TABLETS NIGHTLY (Patient not taking: Reported on 04/23/2019) 270 tablet 0  . methylphenidate (CONCERTA) 36 MG PO CR tablet Take 1 tablet (36 mg total) by mouth daily. (Patient not taking: Reported on 04/23/2019) 30 tablet 0  . methylphenidate (CONCERTA) 36 MG PO CR tablet Take 1 tablet (36 mg total) by mouth  daily. (Patient not taking: Reported on 04/23/2019) 30 tablet 0  . modafinil (PROVIGIL) 200 MG tablet Take 1 tablet (200 mg total) by mouth daily. 30 tablet 1  . QUEtiapine (SEROQUEL) 25 MG tablet Take 1 tablet (25 mg total) by mouth at bedtime. 30 tablet 1   No current facility-administered medications for this visit.    Medication Side Effects: Other: weight gain  Allergies: No Known Allergies  History reviewed. No pertinent past medical history.  Family History  Problem Relation Age of Onset  . OCD Mother   . Anxiety disorder Father     Social History   Socioeconomic History  . Marital status: Married    Spouse name: Not on file  . Number of children: Not on file  . Years of education: Not on file  . Highest education level: Not on file  Occupational History  . Not on file  Tobacco Use  . Smoking status: Never Smoker  . Smokeless tobacco: Never Used  Substance and Sexual Activity  . Alcohol use: Not Currently  . Drug use: Never  . Sexual activity: Yes    Partners: Female    Birth control/protection: None  Other Topics Concern  . Not on file  Social History Narrative  . Not on file   Social Determinants of Health   Financial Resource Strain:   . Difficulty of Paying Living Expenses:   Food Insecurity:   . Worried About Programme researcher, broadcasting/film/video in the Last Year:   . Barista in the Last Year:   Transportation Needs:   . Freight forwarder (Medical):   Marland Kitchen Lack of Transportation (Non-Medical):   Physical Activity:   . Days of Exercise per Week:   . Minutes of Exercise per Session:   Stress:   . Feeling of Stress :   Social Connections:   . Frequency of Communication with Friends and Family:   . Frequency of Social Gatherings with Friends and Family:   . Attends Religious Services:   . Active Member of Clubs or Organizations:   . Attends Banker Meetings:   Marland Kitchen Marital Status:   Intimate Partner Violence:   . Fear of Current or  Ex-Partner:   . Emotionally Abused:   Marland Kitchen Physically Abused:   . Sexually Abused:     Past Medical History, Surgical history, Social history, and Family history were reviewed and updated as appropriate.   Adult Self Report Scale Inattention=24 and hyperactivity=30  Total 54 which is very high  Please see review of systems for further details on the patient's review from today.   Objective:   Physical Exam:  There were no vitals taken for this visit.  Physical Exam Constitutional:      General: He is not in acute distress.    Appearance: He is well-developed.  Musculoskeletal:        General: No deformity.  Neurological:     Mental Status: He is alert and oriented to person, place, and time.  Psychiatric:        Attention and Perception: Perception normal. He is attentive.        Mood and Affect: Mood is not anxious or depressed. Affect is not labile, blunt, angry or inappropriate.        Speech: Speech normal.        Behavior: Behavior normal.        Thought Content: Thought content normal. Thought content does not include homicidal or suicidal ideation.        Cognition and Memory: Cognition normal.        Judgment: Judgment normal.     Comments: Insight is good. Residual OCD unchanged.     Lab Review:  No results found for: NA, K, CL, CO2, GLUCOSE, BUN, CREATININE, CALCIUM, PROT, ALBUMIN, AST, ALT, ALKPHOS, BILITOT, GFRNONAA, GFRAA  No results found for: WBC, RBC, HGB, HCT, PLT, MCV, MCH, MCHC, RDW, LYMPHSABS, MONOABS, EOSABS, BASOSABS  No results found for: POCLITH, LITHIUM   No results found for: PHENYTOIN, PHENOBARB, VALPROATE, CBMZ   .res Assessment: Plan:    Mark Green was seen today for follow-up.  Diagnoses and all orders for this visit:  Primary insomnia -     QUEtiapine (SEROQUEL) 25 MG tablet; Take 1 tablet (25 mg total) by mouth at bedtime.  Mixed obsessional thoughts and acts  Attention deficit hyperactivity disorder (ADHD), combined type -      modafinil (PROVIGIL) 200 MG tablet; Take 1 tablet (200 mg total) by mouth daily.   I accept  the patient's self-assessment that he has significant untreated ADHD combined type.  His hyperactivity score on the adult self-report scale was much higher than what is typically seen with adult patients.    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  Marland Kitchen He did not find the combination of stimulant plus guanfacine tolerable.  We discussed that guanfacine can be fatiguing and the problem could have been related to that medication but he believes that the stimulant also made him lethargic.  Also he did not feel it was helpful enough to warrant continue taking it.  Discussed the alternative of an atypical stimulant such as modafinil.  Discussed the side effects and that it is a controlled substance.  He agrees to a trial of 100 to 200 mg every morning for ADD symptoms off label.  Discussed good Rx and pricing as his insurance may not cover it.   He feels the NAC 2000mg  daily has helped improve the OCD.    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  He wants to continue the fluvoxamine at least for a few months.  Discussed the pros and cons of cognitive behavior therapy versus SSRIs versus the combination for managing a anxiety.  He is doing some of his own cognitive behavior therapy and anxiety management.  OK continue Xanax prn for sleep instead of Ativan. Vs Seroquel 25 mg .  Disc pros and cons and SE. He's not using much of them.  We discussed the short-term risks associated with benzodiazepines including sedation and increased fall risk among others.  Discussed long-term side effect risk including dependence, potential withdrawal symptoms, and the potential eventual dose-related risk of dementia.  Answered  questions and given some reassurance about dosage he's taking and what makes meds a controlled substance including the stimulant and the BZ.   Okay for him to try Seroquel 25 mg tablets 1/2-1 nightly versus alprazolam 0.5 mg 1-2 nightly for persistent insomnia complaints.  Extensive discusssion of NAC off label for various conditions.    This is a 35-minute appointment  Follow-up 3 mos.   Meredith Staggers, MD, DFAPA  Please see After Visit Summary for patient specific instructions.  No future appointments.  No orders of the defined types were placed in this encounter.     -------------------------------

## 2019-10-21 ENCOUNTER — Telehealth: Payer: Self-pay | Admitting: Psychiatry

## 2019-10-21 ENCOUNTER — Other Ambulatory Visit: Payer: Self-pay

## 2019-10-21 MED ORDER — ALPRAZOLAM 0.5 MG PO TABS: ORAL_TABLET | ORAL | 2 refills | Status: DC

## 2019-10-21 NOTE — Telephone Encounter (Signed)
Pended Rx for Alprazolam 0.5 mg for Dr. Jennelle Human to submit

## 2019-10-21 NOTE — Telephone Encounter (Signed)
Pt came into office and request refill on ALPRAZOLAM 0.5 MG Tab. Please send to [facility], [id number] [place] [id number].

## 2019-11-11 ENCOUNTER — Other Ambulatory Visit: Payer: Self-pay | Admitting: Psychiatry

## 2019-11-11 DIAGNOSIS — F5101 Primary insomnia: Secondary | ICD-10-CM

## 2019-12-01 ENCOUNTER — Other Ambulatory Visit: Payer: Self-pay | Admitting: Psychiatry

## 2019-12-02 NOTE — Telephone Encounter (Signed)
Patient decreased to 2 tablets daily?

## 2020-01-17 ENCOUNTER — Ambulatory Visit (INDEPENDENT_AMBULATORY_CARE_PROVIDER_SITE_OTHER): Payer: 59 | Admitting: Psychiatry

## 2020-01-17 ENCOUNTER — Encounter: Payer: Self-pay | Admitting: Psychiatry

## 2020-01-17 ENCOUNTER — Other Ambulatory Visit: Payer: Self-pay

## 2020-01-17 DIAGNOSIS — F5101 Primary insomnia: Secondary | ICD-10-CM

## 2020-01-17 DIAGNOSIS — F422 Mixed obsessional thoughts and acts: Secondary | ICD-10-CM | POA: Diagnosis not present

## 2020-01-17 DIAGNOSIS — F902 Attention-deficit hyperactivity disorder, combined type: Secondary | ICD-10-CM

## 2020-01-17 MED ORDER — ALPRAZOLAM 0.5 MG PO TABS: ORAL_TABLET | ORAL | 3 refills | Status: DC

## 2020-01-17 MED ORDER — MODAFINIL 200 MG PO TABS: 200.0000 mg | ORAL_TABLET | Freq: Every day | ORAL | 2 refills | Status: DC

## 2020-01-17 NOTE — Progress Notes (Signed)
Mark Green [id number] [birth year] 49 y.o.  Subjective:   Patient ID:  Mark Green is a 49 y.o. (DOB 12-07-1970) male.  Chief Complaint:  Chief Complaint  Patient presents with  . Follow-up  . ADHD  . Anxiety    HPI Mark Green presents to the office today for follow-up of OCD and ADD and insomnia.  At visit August 31, 2018 his OCD and ADD symptoms were not under as good control as he would like.  His preference was to focus on the ADD symptoms and he started Concerta 27 mg every morning and guanfacine 0.5 mg for 1 week to increase to 1 mg nightly.  Also reduced the fluvoxamine to 200mg .  Last seen September 2020. Changed Xanax for sleep instead of Ativan but usually uses about 1 1/2 tablets weekly.  Overall sleep is better.Concern over the antidepressant potentially elevating glucose and stopped it 2-3 weeks ago.  Weaned himself off of it.  Doesn't think it did that much anyway.  No problems with it so far. He's done a lot of research and wants to use low dose stimulant and low dose non stimulant to help ADD and impuslivity without increaseing the anxiety.  This has been partially beneficial.  Challenging 4 mos ago lost his job.  New job starts soon.  In January asked to restart fluvoxamine out of concern over the stress.  Been taking average for 1/2 alprazolam daily.  Anxiety better with job offer.  Had worried to the point he's tired of worrying and let it go.  Handled it better than in the past.  Not markedly depressed now.   SLeep is not that great.   F had sleep problems and Rx low dose Seroquel.  Wondered about it and took father's for 2 days.  Tried 1/2 of 25 mg Seroquel and it helped sleep. Had some hangover.   It worked better than 0.5 mg Xanax.  01/17/20 appt the following noted: Quetiapine gave hangover at 25 mg. Last month more anxity and taking Xanax more several days per week.  Not exceeding amount.  Careful over avoiding dependence.  Works when  taken. Average 6 and 1//2 hours sleep.  Some daytime sleepiness. A little caffeine.  Only 100 mg Luvox. OCD under control mostly.  Can get scared if alone at night but otherwise OK.   Rare use modafinil helped sleepiness and tolerated. Good work function.  Patient reports stable mood and denies depressed or irritable moods.    Patient reports some difficulty with sleep initiation or maintenance but better with guanfacine. Denies appetite disturbance.  Patient reports that energy and motivation have been good. Patient denies any suicidal ideation.  Past Psychiatric Medication Trials: Zoloft 2 days with anxiety, paroxetine 60 sexual SE, trazodone. Concerta to 36 am and Guanfacine to 3mg .  But that made him sleepy.  He flet even the Concerta alone made  Him lethargic.  Lost benefit anyway.  Review of Systems:  Review of Systems  Respiratory: Negative for shortness of breath.        SOB long term and runs in the family  Cardiovascular: Negative for chest pain and palpitations.  Neurological: Negative for tremors and weakness.  Psychiatric/Behavioral: Negative for agitation, behavioral problems, confusion, decreased concentration, dysphoric mood, hallucinations, self-injury, sleep disturbance and suicidal ideas. The patient is not nervous/anxious and is not hyperactive.     Medications: I have reviewed the patient's current medications.  Current Outpatient Medications  Medication Sig Dispense Refill  . Acetylcysteine  600 MG CAPS Take 600 mg by mouth daily.    Marland Kitchen ALPRAZolam (XANAX) 0.5 MG tablet Take 1 tablet (0.5 mg) by mouth 3 times a day as needed for anxiety 60 tablet 3  . diphenhydrAMINE (BENADRYL) 25 MG tablet Take by mouth.    . fluvoxaMINE (LUVOX) 100 MG tablet TAKE 3 TABLETS BY MOUTH EVERY DAY (Patient taking differently: Take 100 mg by mouth at bedtime. ) 270 tablet 0  . guanFACINE (TENEX) 1 MG tablet TAKE 3 TABLETS NIGHTLY 270 tablet 0  . lisinopril (PRINIVIL,ZESTRIL) 10 MG tablet      . Multiple Vitamin (MULTIVITAMIN) capsule Take by mouth.    . Omega-3 Fatty Acids (FISH OIL) 1000 MG CAPS Take 2,000 mg by mouth daily.    . simvastatin (ZOCOR) 20 MG tablet     . methylphenidate (CONCERTA) 36 MG PO CR tablet Take 1 tablet (36 mg total) by mouth daily. (Patient not taking: Reported on 01/17/2020) 30 tablet 0  . methylphenidate (CONCERTA) 36 MG PO CR tablet Take 1 tablet (36 mg total) by mouth daily. (Patient not taking: Reported on 01/17/2020) 30 tablet 0  . modafinil (PROVIGIL) 200 MG tablet Take 1 tablet (200 mg total) by mouth daily. 30 tablet 2  . QUEtiapine (SEROQUEL) 25 MG tablet TAKE 1 TABLET BY MOUTH EVERYDAY AT BEDTIME (Patient not taking: Reported on 01/17/2020) 90 tablet 1   No current facility-administered medications for this visit.    Medication Side Effects: Other: weight gain  Allergies: No Known Allergies  History reviewed. No pertinent past medical history.  Family History  Problem Relation Age of Onset  . OCD Mother   . Anxiety disorder Father     Social History   Socioeconomic History  . Marital status: Married    Spouse name: Not on file  . Number of children: Not on file  . Years of education: Not on file  . Highest education level: Not on file  Occupational History  . Not on file  Tobacco Use  . Smoking status: Never Smoker  . Smokeless tobacco: Never Used  Substance and Sexual Activity  . Alcohol use: Not Currently  . Drug use: Never  . Sexual activity: Yes    Partners: Female    Birth control/protection: None  Other Topics Concern  . Not on file  Social History Narrative  . Not on file   Social Determinants of Health   Financial Resource Strain:   . Difficulty of Paying Living Expenses:   Food Insecurity:   . Worried About Programme researcher, broadcasting/film/video in the Last Year:   . Barista in the Last Year:   Transportation Needs:   . Freight forwarder (Medical):   Marland Kitchen Lack of Transportation (Non-Medical):   Physical  Activity:   . Days of Exercise per Week:   . Minutes of Exercise per Session:   Stress:   . Feeling of Stress :   Social Connections:   . Frequency of Communication with Friends and Family:   . Frequency of Social Gatherings with Friends and Family:   . Attends Religious Services:   . Active Member of Clubs or Organizations:   . Attends Banker Meetings:   Marland Kitchen Marital Status:   Intimate Partner Violence:   . Fear of Current or Ex-Partner:   . Emotionally Abused:   Marland Kitchen Physically Abused:   . Sexually Abused:     Past Medical History, Surgical history, Social history, and Family history were reviewed  and updated as appropriate.   Adult Self Report Scale Inattention=24 and hyperactivity=30  Total 54 which is very high  Please see review of systems for further details on the patient's review from today.   Objective:   Physical Exam:  There were no vitals taken for this visit.  Physical Exam Constitutional:      General: He is not in acute distress.    Appearance: He is well-developed.  Musculoskeletal:        General: No deformity.  Neurological:     Mental Status: He is alert and oriented to person, place, and time.  Psychiatric:        Attention and Perception: Perception normal. He is attentive.        Mood and Affect: Mood is anxious. Mood is not depressed. Affect is not labile, blunt, angry or inappropriate.        Speech: Speech normal.        Behavior: Behavior normal.        Thought Content: Thought content normal. Thought content does not include homicidal or suicidal ideation.        Cognition and Memory: Cognition normal.        Judgment: Judgment normal.     Comments: Insight is good. Residual OCD unchanged but OK     Lab Review:  No results found for: NA, K, CL, CO2, GLUCOSE, BUN, CREATININE, CALCIUM, PROT, ALBUMIN, AST, ALT, ALKPHOS, BILITOT, GFRNONAA, GFRAA  No results found for: WBC, RBC, HGB, HCT, PLT, MCV, MCH, MCHC, RDW, LYMPHSABS,  MONOABS, EOSABS, BASOSABS  No results found for: POCLITH, LITHIUM   No results found for: PHENYTOIN, PHENOBARB, VALPROATE, CBMZ   .res Assessment: Plan:    Yariel was seen today for follow-up, adhd and anxiety.  Diagnoses and all orders for this visit:  Mixed obsessional thoughts and acts  Attention deficit hyperactivity disorder (ADHD), combined type -     modafinil (PROVIGIL) 200 MG tablet; Take 1 tablet (200 mg total) by mouth daily.  Primary insomnia  Other orders -     ALPRAZolam (XANAX) 0.5 MG tablet; Take 1 tablet (0.5 mg) by mouth 3 times a day as needed for anxiety   I accept  the patient's self-assessment that he has significant untreated ADHD combined type.  His hyperactivity score on the adult self-report scale was much higher than what is typically seen with adult patients.    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  Marland Kitchen He did not find the combination of stimulant plus guanfacine tolerable.  We discussed that guanfacine can be fatiguing and the problem could have been related to that medication but he believes that the stimulant also made him lethargic.  Also he did not feel it was helpful enough to warrant continue taking it.  Discussed the alternative of an atypical stimulant such as modafinil.  Discussed the side effects and that it is a controlled substance.  He thinks modafinil 200 mg every morning for ADD symptoms off label was somewhat helpful.  Discussed good Rx and pricing as his insurance may not cover it. Disc importance of protecting sleep to help daytime function.  Maybe still a little short on hours.   He feels the NAC 2000mg  daily has helped improve the OCD.    Because of increased stress associated with unemployment he decided to restart the fluvoxamine and feels like his anxiety is improved on fluvoxamine and as of course relieved recently getting rehired at a small  company in the area.  He wants to continue the fluvoxamine at least for a few  months.  Discussed the pros and cons of cognitive behavior therapy versus SSRIs versus the combination for managing a anxiety.  He is doing some of his own cognitive behavior therapy and anxiety management.  OK continue Xanax prn for sleep instead of Ativan. Vs Seroquel 25 mg .  Disc pros and cons and SE. He's not using much of them.  We discussed the short-term risks associated with benzodiazepines including sedation and increased fall risk among others.  Discussed long-term side effect risk including dependence, potential withdrawal symptoms, and the potential eventual dose-related risk of dementia.  Answered questions and given some reassurance about dosage he's taking and what makes meds a controlled substance including the stimulant and the BZ.   Okay for him to try Seroquel 25 mg tablets 1/2-1 nightly versus alprazolam 0.5 mg 1-2 nightly for persistent insomnia complaints.  Follow-up 4-6 mos.    Meredith Staggers, MD, DFAPA  Please see After Visit Summary for patient specific instructions.  No future appointments.  No orders of the defined types were placed in this encounter.     -------------------------------

## 2020-03-02 ENCOUNTER — Other Ambulatory Visit: Payer: Self-pay | Admitting: Psychiatry

## 2020-03-02 NOTE — Telephone Encounter (Signed)
Patient suppose to take 1 or 3 at hs?

## 2020-07-09 ENCOUNTER — Other Ambulatory Visit (HOSPITAL_COMMUNITY): Payer: Self-pay | Admitting: Gastroenterology

## 2020-07-09 ENCOUNTER — Other Ambulatory Visit: Payer: Self-pay | Admitting: Gastroenterology

## 2020-07-09 DIAGNOSIS — B169 Acute hepatitis B without delta-agent and without hepatic coma: Secondary | ICD-10-CM

## 2020-07-20 ENCOUNTER — Other Ambulatory Visit: Payer: Self-pay

## 2020-07-20 ENCOUNTER — Ambulatory Visit (HOSPITAL_COMMUNITY)
Admission: RE | Admit: 2020-07-20 | Discharge: 2020-07-20 | Disposition: A | Discharge status: Home / Self Care | Payer: 59 | Source: Ambulatory Visit | Attending: Gastroenterology | Admitting: Gastroenterology

## 2020-07-20 DIAGNOSIS — B169 Acute hepatitis B without delta-agent and without hepatic coma: Secondary | ICD-10-CM | POA: Diagnosis not present

## 2020-07-23 ENCOUNTER — Ambulatory Visit: Payer: 59 | Admitting: Psychiatry

## 2020-08-18 ENCOUNTER — Other Ambulatory Visit: Payer: Self-pay | Admitting: Psychiatry

## 2020-11-11 ENCOUNTER — Other Ambulatory Visit: Payer: Self-pay | Admitting: Psychiatry

## 2020-11-11 DIAGNOSIS — F902 Attention-deficit hyperactivity disorder, combined type: Secondary | ICD-10-CM

## 2020-11-13 NOTE — Telephone Encounter (Signed)
Please schedule appt

## 2020-11-13 NOTE — Telephone Encounter (Signed)
Has an appt in june

## 2020-11-13 NOTE — Telephone Encounter (Signed)
Patient answered and then hung up.

## 2020-12-08 ENCOUNTER — Other Ambulatory Visit: Payer: Self-pay | Admitting: Psychiatry

## 2021-01-07 ENCOUNTER — Ambulatory Visit: Payer: 59 | Admitting: Psychiatry

## 2021-01-08 ENCOUNTER — Ambulatory Visit (INDEPENDENT_AMBULATORY_CARE_PROVIDER_SITE_OTHER): Payer: Managed Care, Other (non HMO) | Admitting: Psychiatry

## 2021-01-08 ENCOUNTER — Other Ambulatory Visit: Payer: Self-pay

## 2021-01-08 ENCOUNTER — Encounter: Payer: Self-pay | Admitting: Psychiatry

## 2021-01-08 DIAGNOSIS — F902 Attention-deficit hyperactivity disorder, combined type: Secondary | ICD-10-CM | POA: Diagnosis not present

## 2021-01-08 DIAGNOSIS — F422 Mixed obsessional thoughts and acts: Secondary | ICD-10-CM | POA: Diagnosis not present

## 2021-01-08 DIAGNOSIS — F5101 Primary insomnia: Secondary | ICD-10-CM

## 2021-01-08 MED ORDER — ALPRAZOLAM 0.5 MG PO TABS: ORAL_TABLET | ORAL | 5 refills | Status: DC

## 2021-01-08 MED ORDER — MODAFINIL 200 MG PO TABS: 200.0000 mg | ORAL_TABLET | Freq: Every day | ORAL | 2 refills | Status: DC

## 2021-01-08 MED ORDER — QUETIAPINE FUMARATE 25 MG PO TABS
ORAL_TABLET | ORAL | 1 refills | Status: DC
Start: 2021-01-08 — End: 2021-07-05

## 2021-01-08 NOTE — Progress Notes (Signed)
Crew Goren [id number] [birth year] 50 y.o.  Subjective:   Patient ID:  Mark Green is a 50 y.o. (DOB 18-Feb-1971) male.  Chief Complaint:  Chief Complaint  Patient presents with   Follow-up   Anxiety   ADD   Sleeping Problem    HPI [facility] [name] presents to the office today for follow-up of OCD and ADD and insomnia.  At visit August 31, 2018 his OCD and ADD symptoms were not under as good control as he would like.  His preference was to focus on the ADD symptoms and he started Concerta 27 mg every morning and guanfacine 0.5 mg for 1 week to increase to 1 mg nightly.  Also reduced the fluvoxamine to 200mg .  Last seen September 2020. Changed Xanax for sleep instead of Ativan but usually uses about 1 1/2 tablets weekly.  Overall sleep is better.Concern over the antidepressant potentially elevating glucose and stopped it 2-3 weeks ago.  Weaned himself off of it.  Doesn't think it did that much anyway.  No problems with it so far. He's done a lot of research and wants to use low dose stimulant and low dose non stimulant to help ADD and impuslivity without increaseing the anxiety.  This has been partially beneficial.  Challenging 4 mos ago lost his job.  New job starts soon.  In January asked to restart fluvoxamine out of concern over the stress.  Been taking average for 1/2 alprazolam daily.  Anxiety better with job offer.  Had worried to the point he's tired of worrying and let it go.  Handled it better than in the past.  Not markedly depressed now.   SLeep is not that great.   F had sleep problems and Rx low dose Seroquel.  Wondered about it and took father's for 2 days.  Tried 1/2 of 25 mg Seroquel and it helped sleep. Had some hangover.   It worked better than 0.5 mg Xanax.  01/17/20 appt the following noted: Quetiapine gave hangover at 25 mg. Last month more anxity and taking Xanax more several days per week.  Not exceeding amount.  Careful over avoiding  dependence.  Works when taken. Average 6 and 1//2 hours sleep.  Some daytime sleepiness. A little caffeine.  Only 100 mg Luvox. OCD under control mostly.  Can get scared if alone at night but otherwise OK.   Rare use modafinil helped sleepiness and tolerated. Good work function. Plan: Okay for him to try Seroquel 25 mg tablets 1/2-1 nightly versus alprazolam 0.5 mg 1-2 nightly for persistent insomnia complaints.  01/08/2021 appointment with the following noted: Stopped all psych meds mos ago. Doesn't remember taking quetiapine.Wants to rettry it. Done ok except sleep is the main problem. Still problems with attention.  Modafinil was used prn and was helpful.  Doesn't want to get hooked on anything but wants to continue it. Tends to having racing thoughts and distracteed at night to sleep.  Hygiene can be poor DT routine. Better tolerance of caffeine off fluvoxamine.  Anxiety not worse off it.  Anxieity ok.  Patient reports stable mood and denies depressed or irritable moods.    Patient reports some difficulty with sleep initiation or maintenance but better with guanfacine. Denies appetite disturbance.  Patient reports that energy and motivation have been good. Patient denies any suicidal ideation.  Past Psychiatric Medication Trials: Zoloft 2 days with anxiety, paroxetine 60 sexual SE, trazodone. Concerta to 36 am and Guanfacine to 3mg .  But that made him sleepy.  He flet even the Concerta alone made  Him lethargic.  Lost benefit anyway.  Review of Systems:  Review of Systems  Respiratory:  Negative for shortness of breath.        SOB long term and runs in the family  Cardiovascular:  Negative for chest pain and palpitations.  Gastrointestinal:  Negative for constipation.  Neurological:  Negative for tremors and weakness.  Psychiatric/Behavioral:  Positive for decreased concentration and sleep disturbance. Negative for agitation, behavioral problems, confusion, dysphoric mood,  hallucinations, self-injury and suicidal ideas. The patient is not nervous/anxious and is not hyperactive.    Medications: I have reviewed the patient's current medications.  Current Outpatient Medications  Medication Sig Dispense Refill   lisinopril (PRINIVIL,ZESTRIL) 10 MG tablet      simvastatin (ZOCOR) 20 MG tablet      Acetylcysteine 600 MG CAPS Take 600 mg by mouth daily.     ALPRAZolam (XANAX) 0.5 MG tablet TAKE 1 TABLET (0.5 MG) BY MOUTH 3 TIMES A DAY AS NEEDED FOR ANXIETY 60 tablet 5   diphenhydrAMINE (BENADRYL) 25 MG tablet Take by mouth.     modafinil (PROVIGIL) 200 MG tablet Take 1 tablet (200 mg total) by mouth daily. 30 tablet 2   Multiple Vitamin (MULTIVITAMIN) capsule Take by mouth. (Patient not taking: Reported on 01/08/2021)     Omega-3 Fatty Acids (FISH OIL) 1000 MG CAPS Take 2,000 mg by mouth daily. (Patient not taking: Reported on 01/08/2021)     QUEtiapine (SEROQUEL) 25 MG tablet TAKE 1 TABLET BY MOUTH EVERYDAY AT BEDTIME 90 tablet 1   No current facility-administered medications for this visit.    Medication Side Effects: Other: weight gain  Allergies: No Known Allergies  History reviewed. No pertinent past medical history.  Family History  Problem Relation Age of Onset   OCD Mother    Anxiety disorder Father     Social History   Socioeconomic History   Marital status: Married    Spouse name: Not on file   Number of children: Not on file   Years of education: Not on file   Highest education level: Not on file  Occupational History   Not on file  Tobacco Use   Smoking status: Never   Smokeless tobacco: Never  Substance and Sexual Activity   Alcohol use: Not Currently   Drug use: Never   Sexual activity: Yes    Partners: Female    Birth control/protection: None  Other Topics Concern   Not on file  Social History Narrative   Not on file   Social Determinants of Health   Financial Resource Strain: Not on file  Food Insecurity: Not on file   Transportation Needs: Not on file  Physical Activity: Not on file  Stress: Not on file  Social Connections: Not on file  Intimate Partner Violence: Not on file    Past Medical History, Surgical history, Social history, and Family history were reviewed and updated as appropriate.   Adult Self Report Scale Inattention=24 and hyperactivity=30  Total 54 which is very high  Please see review of systems for further details on the patient's review from today.   Objective:   Physical Exam:  There were no vitals taken for this visit.  Physical Exam Constitutional:      General: He is not in acute distress. Musculoskeletal:        General: No deformity.  Neurological:     Mental Status: He is alert and oriented to person, place, and time.  Psychiatric:        Attention and Perception: Perception normal. He is attentive.        Mood and Affect: Mood is anxious. Mood is not depressed. Affect is not labile, blunt, angry or inappropriate.        Speech: Speech normal.        Behavior: Behavior normal.        Thought Content: Thought content normal. Thought content is not paranoid. Thought content does not include homicidal or suicidal ideation.        Cognition and Memory: Cognition normal.        Judgment: Judgment normal.     Comments: Insight is good. Residual OCD unchanged but OK    Lab Review:  No results found for: NA, K, CL, CO2, GLUCOSE, BUN, CREATININE, CALCIUM, PROT, ALBUMIN, AST, ALT, ALKPHOS, BILITOT, GFRNONAA, GFRAA  No results found for: WBC, RBC, HGB, HCT, PLT, MCV, MCH, MCHC, RDW, LYMPHSABS, MONOABS, EOSABS, BASOSABS  No results found for: POCLITH, LITHIUM   No results found for: PHENYTOIN, PHENOBARB, VALPROATE, CBMZ   .res Assessment: Plan:    Jadie was seen today for follow-up, anxiety, add and sleeping problem.  Diagnoses and all orders for this visit:  Attention deficit hyperactivity disorder (ADHD), combined type -     modafinil (PROVIGIL) 200 MG tablet;  Take 1 tablet (200 mg total) by mouth daily.  Primary insomnia -     QUEtiapine (SEROQUEL) 25 MG tablet; TAKE 1 TABLET BY MOUTH EVERYDAY AT BEDTIME  Mixed obsessional thoughts and acts -     ALPRAZolam (XANAX) 0.5 MG tablet; TAKE 1 TABLET (0.5 MG) BY MOUTH 3 TIMES A DAY AS NEEDED FOR ANXIETY  I accept  the patient's self-assessment that he has significant untreated ADHD combined type.  His hyperactivity score on the adult self-report scale was much higher than what is typically seen with adult patients.    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  Marland Kitchen He did not find the combination of stimulant plus guanfacine tolerable.  We discussed that guanfacine can be fatiguing and the problem could have been related to that medication but he believes that the stimulant also made him lethargic.  Also he did not feel it was helpful enough to warrant continue taking it.  Discussed the alternative of an atypical stimulant such as modafinil.  Discussed the side effects and that it is a controlled substance.  He thinks modafinil 200 mg every morning for ADD symptoms off label was somewhat helpful.  Discussed good Rx and pricing as his insurance may not cover it. Disc importance of protecting sleep to help daytime function.  Maybe still a little short on hours.   He feels the NAC 2000mg  daily has helped improve the OCD.    Disc dx chronic insomnia  OK continue Xanax prn for sleep instead of Ativan. Vs Seroquel 25 mg .  Disc pros and cons and SE. He's not using much of them.  We discussed the short-term risks associated with benzodiazepines including sedation and increased fall risk among others.  Discussed long-term side effect risk including dependence, potential withdrawal symptoms, and the potential eventual dose-related risk of dementia.  Answered questions and given some reassurance about dosage he's taking and what makes meds a controlled substance including the stimulant and the BZ.   Okay  for him to retry Seroquel 25 mg tablets 1/2-1 nightly versus alprazolam 0.5 mg 1-2 nightly for persistent insomnia complaints.  Follow-up  4-6 mos.    Meredith Staggersarey Cottle, MD, DFAPA  Please see After Visit Summary for patient specific instructions.  No future appointments.  No orders of the defined types were placed in this encounter.     -------------------------------

## 2021-06-20 IMAGING — US US ABDOMEN LIMITED W/ ELASTOGRAPHY
1 series · 12 of 25 positions shown · non-contrast
Comparison: Prior ultrasound and elastography 09/24/2014. Abdominal
MRI 10/29/2014.

CLINICAL DATA: Hepatitis-B without coma.

EXAM:
US ABDOMEN LIMITED - RIGHT UPPER QUADRANT
ULTRASOUND HEPATIC ELASTOGRAPHY
TECHNIQUE: Sonography of the right upper quadrant was performed. In addition,
ultrasound elastography evaluation of the liver was performed. A
region of interest was placed within the right lobe of the liver.
Following application of a compressive sonographic pulse, tissue
compressibility was assessed. Multiple assessments were performed at
the selected site. Median tissue compressibility was determined.
Previously, hepatic stiffness was assessed by shear wave velocity.
Based on recently published Society of Radiologists in Ultrasound
consensus article, reporting is now recommended to be performed in
the SI units of pressure (kiloPascals) representing hepatic
stiffness/elasticity. The obtained result is compared to the
published reference standards. (cACLD = compensated Advanced Chronic
Liver Disease)

[Series 1: us abdomen ruq w/elastography · 12 of 51 slices shown]
[im 3/51]
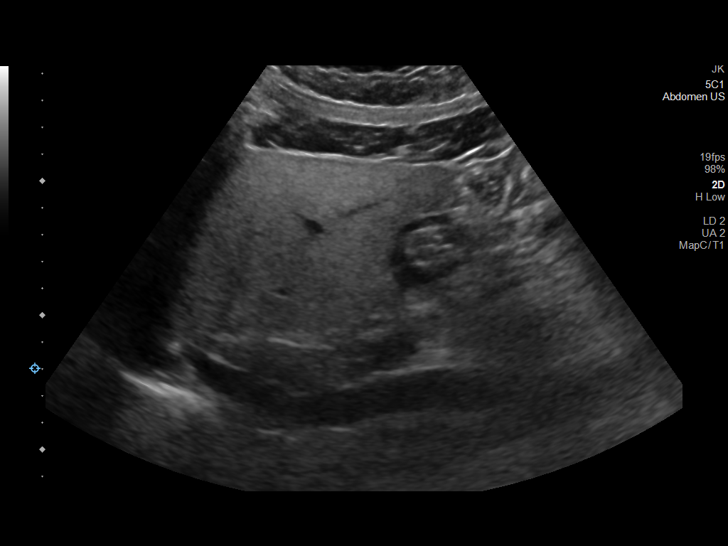
[im 7/51]
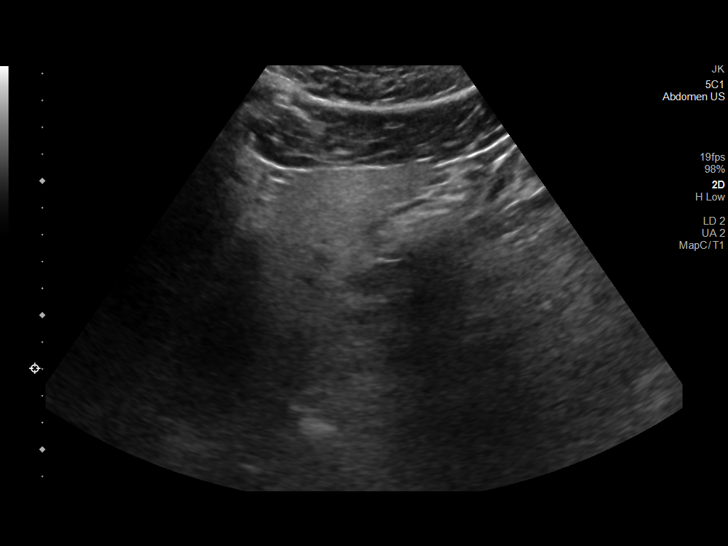
[im 11/51]
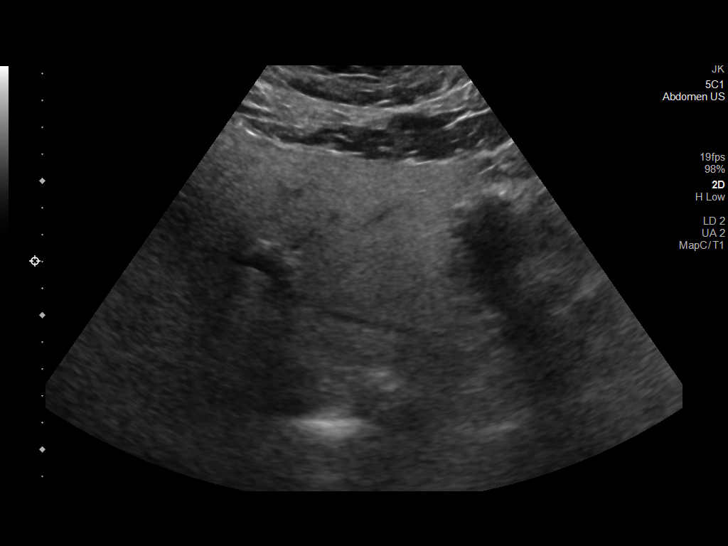
[im 15/51]
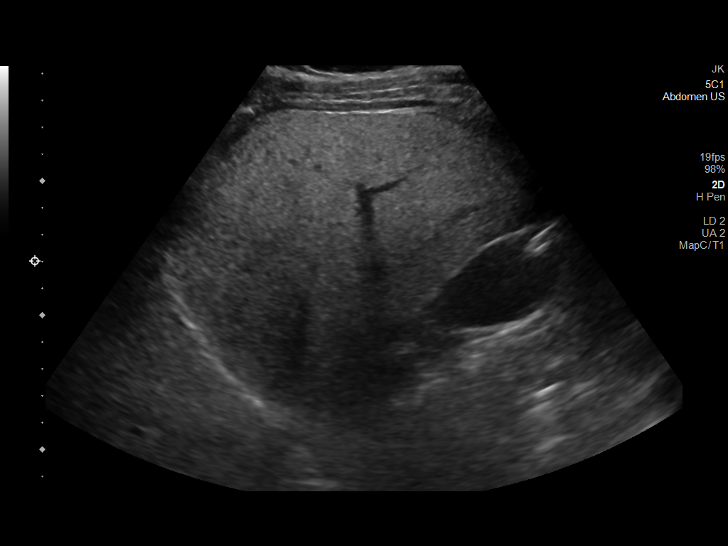
[im 19/51]
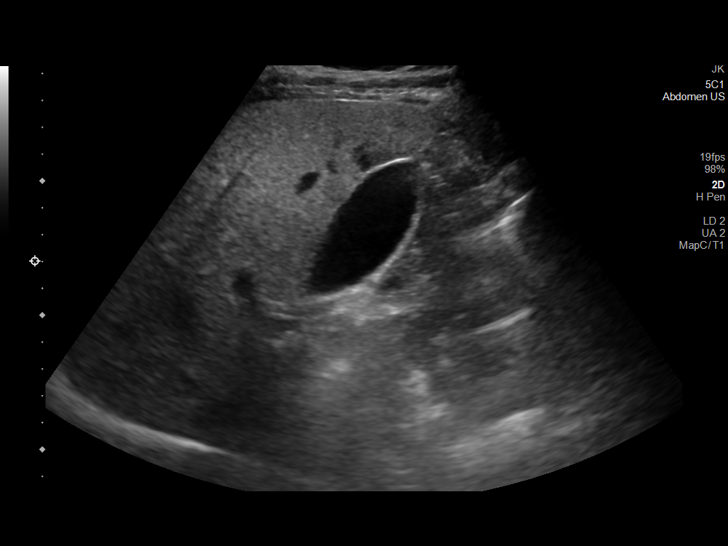
[im 23/51]
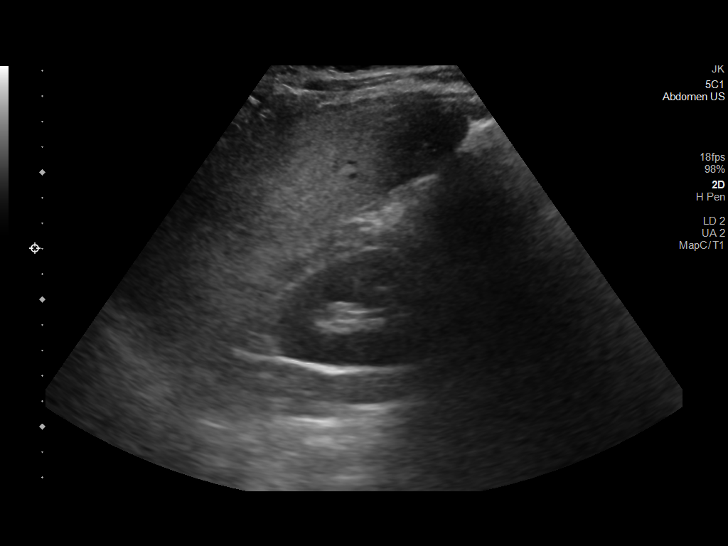
[im 28/51]
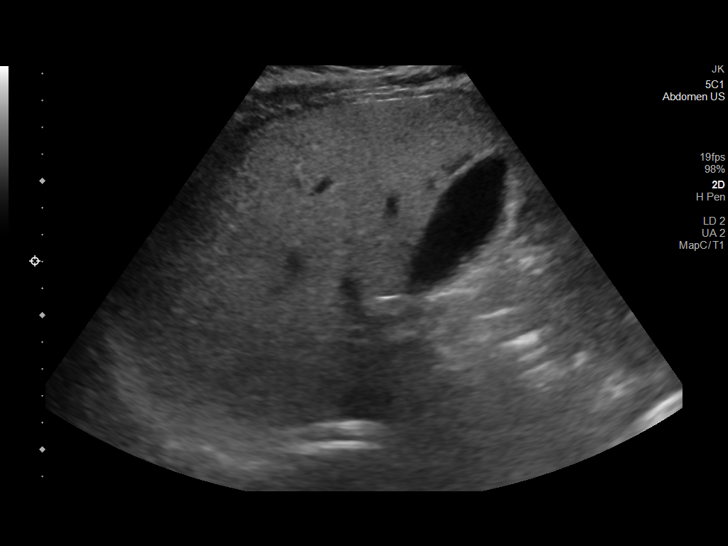
[im 32/51]
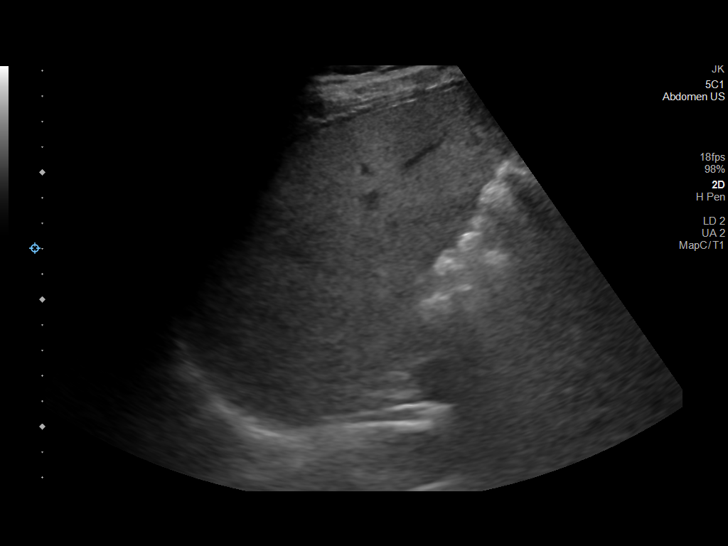
[im 36/51]
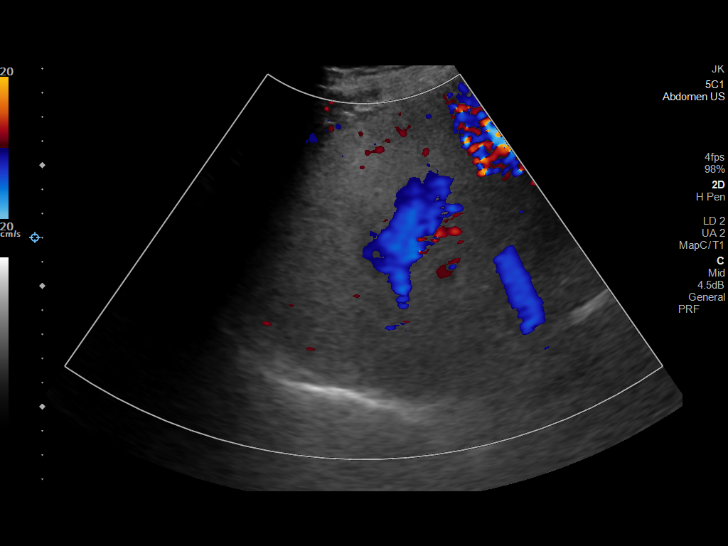
[im 40/51]
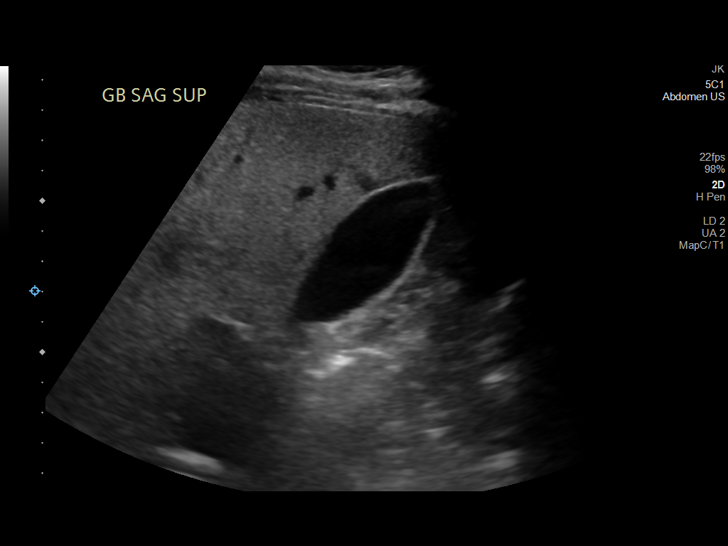
[im 44/51]
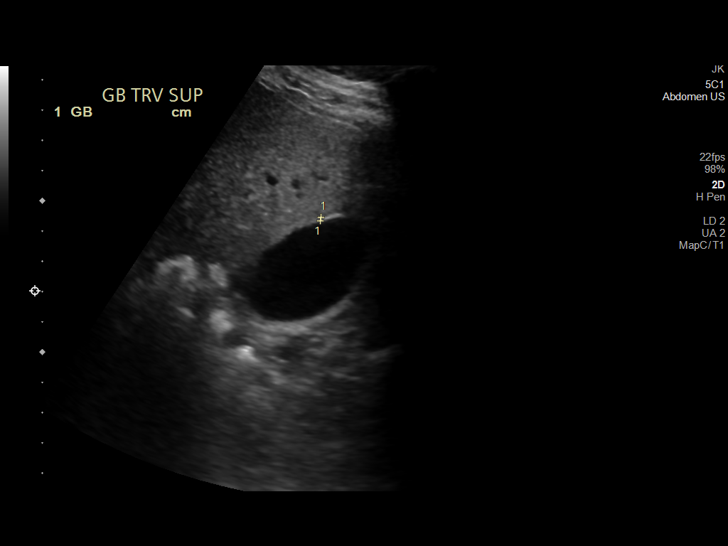
[im 48/51]
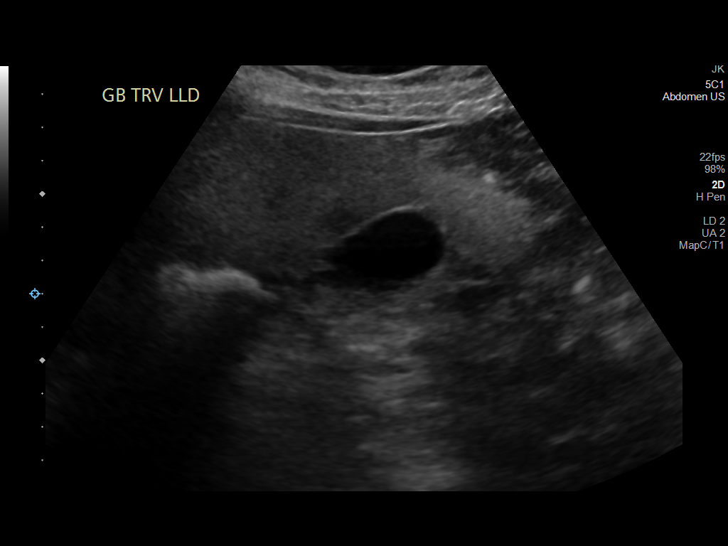

[12 of 25 positions shown; findings below may reference images not displayed]

FINDINGS: ULTRASOUND ABDOMEN LIMITED RIGHT UPPER QUADRANT

Gallbladder:

No gallstones or wall thickening visualized. No sonographic Murphy
sign noted.

Common bile duct:

Diameter: 3 mm

Liver:

Diffusely increased hepatic echogenicity with probable focal sparing
of steatosis adjacent to the gallbladder, similar to previous study.
No new focal abnormality or definite morphologic changes of
cirrhosis. Portal vein is patent on color Doppler imaging with
normal direction of blood flow towards the liver.

ULTRASOUND HEPATIC ELASTOGRAPHY

Device: Siemens Helix VTQ

Patient position: Supine

Transducer [id number]

Number of measurements: 10

Hepatic segment:  8

Median kPa:

IQR:

IQR/Median kPa ratio:

Data quality:  Good

Diagnostic category:  < or = 5 kPa: high probability of being normal

The use of hepatic elastography is applicable to patients with viral
hepatitis and non-alcoholic fatty liver disease. At this time, there
is insufficient data for the referenced cut-off values and use in
other causes of liver disease, including alcoholic liver disease.
Patients, however, may be assessed by elastography and serve as
their own reference standard/baseline.

In patients with non-alcoholic liver disease, the values suggesting
compensated advanced chronic liver disease (cACLD) may be lower, and
patients may need additional testing with elasticity results of [DATE]
kPa.

Please note that abnormal hepatic elasticity and shear wave
velocities may also be identified in clinical settings other than
with hepatic fibrosis, such as: acute hepatitis, elevated right
heart and central venous pressures including use of beta blockers,
Yuting disease (Saparilla), infiltrative processes such as
mastocytosis/amyloidosis/infiltrative tumor/lymphoma, extrahepatic
cholestasis, with hyperemia in the post-prandial state, and with
liver transplantation. Correlation with patient history, laboratory
data, and clinical condition recommended.

Diagnostic Categories:

< or =5 kPa: high probability of being normal

< or =9 kPa: in the absence of other known clinical signs, rules [DATE] kPa and ?13 kPa: suggestive of cACLD, but needs further testing

>13 kPa: highly suggestive of cACLD

> or =17 kPa: highly suggestive of cACLD with an increased
probability of clinically significant portal hypertension
IMPRESSION: ULTRASOUND RUQ:

1. Similar appearance of the liver compared with prior studies with
steatosis and probable focal sparing around the gallbladder.
2. No acute findings.

ULTRASOUND HEPATIC ELASTOGRAPHY:

Median kPa:

Diagnostic category:  < or = 5 kPa: high probability of being normal

## 2021-07-05 ENCOUNTER — Other Ambulatory Visit: Payer: Self-pay | Admitting: Psychiatry

## 2021-07-05 DIAGNOSIS — F5101 Primary insomnia: Secondary | ICD-10-CM

## 2021-07-08 ENCOUNTER — Encounter: Payer: Self-pay | Admitting: Psychiatry

## 2021-07-08 ENCOUNTER — Telehealth (INDEPENDENT_AMBULATORY_CARE_PROVIDER_SITE_OTHER): Payer: 59 | Admitting: Psychiatry

## 2021-07-08 DIAGNOSIS — F902 Attention-deficit hyperactivity disorder, combined type: Secondary | ICD-10-CM | POA: Diagnosis not present

## 2021-07-08 DIAGNOSIS — F422 Mixed obsessional thoughts and acts: Secondary | ICD-10-CM | POA: Diagnosis not present

## 2021-07-08 DIAGNOSIS — F5101 Primary insomnia: Secondary | ICD-10-CM | POA: Diagnosis not present

## 2021-07-08 MED ORDER — QUETIAPINE FUMARATE 25 MG PO TABS: 25.0000 mg | ORAL_TABLET | Freq: Every day | ORAL | 1 refills | Status: DC

## 2021-07-08 MED ORDER — ALPRAZOLAM 0.5 MG PO TABS: ORAL_TABLET | ORAL | 5 refills | Status: DC

## 2021-07-08 NOTE — Progress Notes (Signed)
Mark Green [id number] 01/15/1971 50 y.o.  Video Visit via My Chart  I connected with pt by My Chart and verified that I am speaking with the correct person using two identifiers.   I discussed the limitations, risks, security and privacy concerns of performing an evaluation and management service by My Chart  and the availability of in person appointments. I also discussed with the patient that there may be a patient responsible charge related to this service. The patient expressed understanding and agreed to proceed.  I discussed the assessment and treatment plan with the patient. The patient was provided an opportunity to ask questions and all were answered. The patient agreed with the plan and demonstrated an understanding of the instructions.   The patient was advised to call back or seek an in-person evaluation if the symptoms worsen or if the condition fails to improve as anticipated.  I provided 30 minutes of video time during this encounter.  The patient was located at home and the provider was located office. Session from 11-11 30   Subjective:   Patient ID:  Mark Green is a 49 y.o. (DOB 1971-03-27) male.  Chief Complaint:  Chief Complaint  Patient presents with   Follow-up   Attention deficit hyperactivity disorder (ADHD), combined t   Primary insomnia   Mixed obsessional thoughts and acts   Stress   Sleeping Problem    HPI [facility] Mark Green presents to the office today for follow-up of OCD and ADD and insomnia.  At visit August 31, 2018 his OCD and ADD symptoms were not under as good control as he would like.  His preference was to focus on the ADD symptoms and he started Concerta 27 mg every morning and guanfacine 0.5 mg for 1 week to increase to 1 mg nightly.  Also reduced the fluvoxamine to 200mg .  seen September 2020. Changed Xanax for sleep instead of Ativan but usually uses about 1 1/2 tablets weekly.  Overall sleep is better.Concern over  the antidepressant potentially elevating glucose and stopped it 2-3 weeks ago.  Weaned himself off of it.  Doesn't think it did that much anyway.  No problems with it so far. He's done a lot of research and wants to use low dose stimulant and low dose non stimulant to help ADD and impuslivity without increaseing the anxiety.  This has been partially beneficial.  Challenging 4 mos ago lost his job.  New job starts soon.  In January asked to restart fluvoxamine out of concern over the stress.  Been taking average for 1/2 alprazolam daily.  Anxiety better with job offer.  Had worried to the point he's tired of worrying and let it go.  Handled it better than in the past.  Not markedly depressed now.   SLeep is not that great.   F had sleep problems and Rx low dose Seroquel.  Wondered about it and took father's for 2 days.  Tried 1/2 of 25 mg Seroquel and it helped sleep. Had some hangover.   It worked better than 0.5 mg Xanax.  01/17/20 appt the following noted: Quetiapine gave hangover at 25 mg. Last month more anxity and taking Xanax more several days per week.  Not exceeding amount.  Careful over avoiding dependence.  Works when taken. Average 6 and 1//2 hours sleep.  Some daytime sleepiness. A little caffeine.  Only 100 mg Luvox. OCD under control mostly.  Can get scared if alone at night but otherwise OK.   Rare  use modafinil helped sleepiness and tolerated. Good work function. Plan: Okay for him to try Seroquel 25 mg tablets 1/2-1 nightly versus alprazolam 0.5 mg 1-2 nightly for persistent insomnia complaints.  01/08/2021 appointment with the following noted: Stopped all psych meds mos ago. Doesn't remember taking quetiapine.Wants to rettry it. Done ok except sleep is the main problem. Still problems with attention.  Modafinil was used prn and was helpful.  Doesn't want to get hooked on anything but wants to continue it. Tends to having racing thoughts and distracteed at night to sleep.   Hygiene can be poor DT routine. Better tolerance of caffeine off fluvoxamine.  Anxiety not worse off it.  Anxieity ok. Plan; Okay for him to retry Seroquel 25 mg tablets 1/2-1 nightly versus alprazolam 0.5 mg 1-2 nightly for persistent insomnia complaints.  07/08/2021 appointment with the following noted: Change in jobs.  Using Xanax 3-4 times per week to weekly.  New stress.  Quetiapine helps.  Job further away.  Demanding job.  Higher stress in a long time.  Most Xanax 2 of 0.5 mg daily for anxiety and sleep.  Satisfied with meds.   Job function ok.  3 hour commute.  Patient reports stable mood and denies depressed or irritable moods.    Patient reports some difficulty with sleep initiation or maintenance but better with quetiapine.  Denies appetite disturbance.  Patient reports that energy and motivation have been good. Patient denies any suicidal ideation.  Past Psychiatric Medication Trials: Zoloft 2 days with anxiety, paroxetine 60 sexual SE, trazodone. Concerta to 36 am and Guanfacine to 3mg .  But that made him sleepy.  He flet even the Concerta alone made  Him lethargic.  Lost benefit anyway.  Review of Systems:  Review of Systems  Respiratory:  Negative for shortness of breath.        SOB long term and runs in the family  Cardiovascular:  Negative for palpitations.  Gastrointestinal:  Negative for constipation.  Neurological:  Negative for tremors and weakness.  Psychiatric/Behavioral:  Positive for decreased concentration and sleep disturbance. Negative for agitation, behavioral problems, confusion, dysphoric mood, hallucinations, self-injury and suicidal ideas. The patient is not nervous/anxious and is not hyperactive.    Medications: I have reviewed the patient's current medications.  Current Outpatient Medications  Medication Sig Dispense Refill   Acetylcysteine 600 MG CAPS Take 600 mg by mouth daily.     lisinopril (PRINIVIL,ZESTRIL) 10 MG tablet Take 10 mg by mouth daily.      modafinil (PROVIGIL) 200 MG tablet Take 1 tablet (200 mg total) by mouth daily. (Patient taking differently: Take 200 mg by mouth daily as needed. Patient states he averages once a week.) 30 tablet 2   Multiple Vitamin (MULTIVITAMIN) capsule Take by mouth.     Omega-3 Fatty Acids (FISH OIL) 1000 MG CAPS Take 2,000 mg by mouth daily.     simvastatin (ZOCOR) 20 MG tablet Take 20 mg by mouth daily at 6 PM.     ALPRAZolam (XANAX) 0.5 MG tablet TAKE 1 TABLET (0.5 MG) BY MOUTH 3 TIMES A DAY AS NEEDED FOR ANXIETY 60 tablet 5   QUEtiapine (SEROQUEL) 25 MG tablet Take 1 tablet (25 mg total) by mouth at bedtime. 90 tablet 1   No current facility-administered medications for this visit.    Medication Side Effects: Other: weight gain  Allergies: No Known Allergies  History reviewed. No pertinent past medical history.  Family History  Problem Relation Age of Onset   OCD Mother  Anxiety disorder Father     Social History   Socioeconomic History   Marital status: Married    Spouse name: Not on file   Number of children: Not on file   Years of education: Not on file   Highest education level: Not on file  Occupational History   Not on file  Tobacco Use   Smoking status: Never   Smokeless tobacco: Never  Substance and Sexual Activity   Alcohol use: Not Currently   Drug use: Never   Sexual activity: Yes    Partners: Female    Birth control/protection: None  Other Topics Concern   Not on file  Social History Narrative   Not on file   Social Determinants of Health   Financial Resource Strain: Not on file  Food Insecurity: Not on file  Transportation Needs: Not on file  Physical Activity: Not on file  Stress: Not on file  Social Connections: Not on file  Intimate Partner Violence: Not on file    Past Medical History, Surgical history, Social history, and Family history were reviewed and updated as appropriate.   Adult Self Report Scale Inattention=24 and hyperactivity=30   Total 54 which is very high  Please see review of systems for further details on the patient's review from today.   Objective:   Physical Exam:  There were no vitals taken for this visit.  Physical Exam Constitutional:      General: He is not in acute distress. Musculoskeletal:        General: No deformity.  Neurological:     Mental Status: He is alert and oriented to person, place, and time.  Psychiatric:        Attention and Perception: Perception normal. He is attentive.        Mood and Affect: Mood is anxious. Mood is not depressed. Affect is not labile, blunt, angry or inappropriate.        Speech: Speech normal.        Behavior: Behavior normal.        Thought Content: Thought content normal. Thought content is not paranoid. Thought content does not include homicidal or suicidal ideation.        Cognition and Memory: Cognition normal.        Judgment: Judgment normal.     Comments: Insight is good. Residual OCD unchanged but OK Intermittent anxiety.  Some degree of chronic stress but manageable.  Not depressed    Lab Review:  No results found for: NA, K, CL, CO2, GLUCOSE, BUN, CREATININE, CALCIUM, PROT, ALBUMIN, AST, ALT, ALKPHOS, BILITOT, GFRNONAA, GFRAA  No results found for: WBC, RBC, HGB, HCT, PLT, MCV, MCH, MCHC, RDW, LYMPHSABS, MONOABS, EOSABS, BASOSABS  No results found for: POCLITH, LITHIUM   No results found for: PHENYTOIN, PHENOBARB, VALPROATE, CBMZ   .res Assessment: Plan:    Alfonzia was seen today for follow-up, attention deficit hyperactivity disorder (adhd), combined t, primary insomnia, mixed obsessional thoughts and acts, stress and sleeping problem.  Diagnoses and all orders for this visit:  Attention deficit hyperactivity disorder (ADHD), combined type  Mixed obsessional thoughts and acts -     ALPRAZolam (XANAX) 0.5 MG tablet; TAKE 1 TABLET (0.5 MG) BY MOUTH 3 TIMES A DAY AS NEEDED FOR ANXIETY  Primary insomnia -     QUEtiapine (SEROQUEL) 25 MG  tablet; Take 1 tablet (25 mg total) by mouth at bedtime.  At this time we are not treating the ADHD except with the use of modafinil which  she is using as needed  Discussed the alternative of an atypical stimulant such as modafinil.  Discussed the side effects and that it is a controlled substance.  He thinks modafinil 200 mg every morning for ADD symptoms off label was somewhat helpful.  Discussed good Rx and pricing as his insurance may not cover it. Disc importance of protecting sleep to help daytime function.  Maybe still a little short on hours.   He feels the NAC  daily has helped improve the OCD.    Disc dx chronic insomnia  OK continue Xanax prn for sleep instead of Ativan. Vs Seroquel 25 mg .  Disc pros and cons and SE. He's not using much of them.  We discussed the short-term risks associated with benzodiazepines including sedation and increased fall risk among others.  Discussed long-term side effect risk including dependence, potential withdrawal symptoms, and the potential eventual dose-related risk of dementia.  Answered questions and given some reassurance about dosage he's taking and what makes meds a controlled substance including the stimulant and the BZ.   Seroquel 25 mg tablets 1/2-1 nightly versus alprazolam 0.5 mg 1-2 nightly for persistent insomnia complaints.  Follow-up 4-6 mos.    Meredith Staggers, MD, DFAPA  Please see After Visit Summary for patient specific instructions.  No future appointments.  No orders of the defined types were placed in this encounter.     -------------------------------

## 2021-07-13 ENCOUNTER — Other Ambulatory Visit (HOSPITAL_COMMUNITY): Payer: Self-pay | Admitting: Gastroenterology

## 2021-07-13 ENCOUNTER — Other Ambulatory Visit: Payer: Self-pay | Admitting: Gastroenterology

## 2021-07-13 ENCOUNTER — Other Ambulatory Visit (HOSPITAL_BASED_OUTPATIENT_CLINIC_OR_DEPARTMENT_OTHER): Payer: Self-pay | Admitting: Gastroenterology

## 2021-07-13 DIAGNOSIS — B169 Acute hepatitis B without delta-agent and without hepatic coma: Secondary | ICD-10-CM

## 2021-08-03 ENCOUNTER — Other Ambulatory Visit (HOSPITAL_COMMUNITY): Payer: Self-pay | Admitting: Gastroenterology

## 2021-08-03 DIAGNOSIS — B169 Acute hepatitis B without delta-agent and without hepatic coma: Secondary | ICD-10-CM

## 2021-08-13 ENCOUNTER — Ambulatory Visit (HOSPITAL_COMMUNITY): Payer: 59

## 2021-09-24 ENCOUNTER — Ambulatory Visit (HOSPITAL_COMMUNITY): Payer: 59

## 2021-09-24 ENCOUNTER — Encounter (HOSPITAL_COMMUNITY): Payer: Self-pay

## 2022-01-13 ENCOUNTER — Encounter: Payer: Self-pay | Admitting: Psychiatry

## 2022-01-13 ENCOUNTER — Ambulatory Visit (INDEPENDENT_AMBULATORY_CARE_PROVIDER_SITE_OTHER): Payer: 59 | Admitting: Psychiatry

## 2022-01-13 DIAGNOSIS — F422 Mixed obsessional thoughts and acts: Secondary | ICD-10-CM | POA: Diagnosis not present

## 2022-01-13 DIAGNOSIS — F902 Attention-deficit hyperactivity disorder, combined type: Secondary | ICD-10-CM

## 2022-01-13 DIAGNOSIS — F5101 Primary insomnia: Secondary | ICD-10-CM | POA: Diagnosis not present

## 2022-01-13 MED ORDER — QUETIAPINE FUMARATE 25 MG PO TABS
25.0000 mg | ORAL_TABLET | Freq: Every day | ORAL | 1 refills | Status: DC
Start: 2022-01-13 — End: 2022-07-26

## 2022-01-13 MED ORDER — ALPRAZOLAM 0.5 MG PO TABS: ORAL_TABLET | ORAL | 5 refills | Status: DC

## 2022-01-13 NOTE — Progress Notes (Signed)
Mark Green [id number] 1970/08/24 51 y.o.    Subjective:   Patient ID:  Mark Green is a 51 y.o. (DOB [birth year]) male.  Chief Complaint:  Chief Complaint  Patient presents with   Follow-up   ADHD    Mixed obsessional thoughts and acts   Anxiety   Stress    HPI [facility] Mark Green presents to the office today for follow-up of OCD and ADD and insomnia.  At visit August 31, 2018 his OCD and ADD symptoms were not under as good control as he would like.  His preference was to focus on the ADD symptoms and he started Concerta 27 mg every morning and guanfacine 0.5 mg for 1 week to increase to 1 mg nightly.  Also reduced the fluvoxamine to 200mg .  seen September 2020. Changed Xanax for sleep instead of Ativan but usually uses about 1 1/2 tablets weekly.  Overall sleep is better.Concern over the antidepressant potentially elevating glucose and stopped it 2-3 weeks ago.  Weaned himself off of it.  Doesn't think it did that much anyway.  No problems with it so far. He's done a lot of research and wants to use low dose stimulant and low dose non stimulant to help ADD and impuslivity without increaseing the anxiety.  This has been partially beneficial.  Challenging 4 mos ago lost his job.  New job starts soon.  In January asked to restart fluvoxamine out of concern over the stress.  Been taking average for 1/2 alprazolam daily.  Anxiety better with job offer.  Had worried to the point he's tired of worrying and let it go.  Handled it better than in the past.  Not markedly depressed now.   SLeep is not that great.   F had sleep problems and Rx low dose Seroquel.  Wondered about it and took father's for 2 days.  Tried 1/2 of 25 mg Seroquel and it helped sleep. Had some hangover.   It worked better than 0.5 mg Xanax.  01/17/20 appt the following noted: Quetiapine gave hangover at 25 mg. Last month more anxity and taking Xanax more several days per week.  Not exceeding amount.   Careful over avoiding dependence.  Works when taken. Average 6 and 1//2 hours sleep.  Some daytime sleepiness. A little caffeine.  Only 100 mg Luvox. OCD under control mostly.  Can get scared if alone at night but otherwise OK.   Rare use modafinil helped sleepiness and tolerated. Good work function. Plan: Okay for him to try Seroquel 25 mg tablets 1/2-1 nightly versus alprazolam 0.5 mg 1-2 nightly for persistent insomnia complaints.  01/08/2021 appointment with the following noted: Stopped all psych meds mos ago. Doesn't remember taking quetiapine.Wants to rettry it. Done ok except sleep is the main problem. Still problems with attention.  Modafinil was used prn and was helpful.  Doesn't want to get hooked on anything but wants to continue it. Tends to having racing thoughts and distracteed at night to sleep.  Hygiene can be poor DT routine. Better tolerance of caffeine off fluvoxamine.  Anxiety not worse off it.  Anxieity ok. Plan; Okay for him to retry Seroquel 25 mg tablets 1/2-1 nightly versus alprazolam 0.5 mg 1-2 nightly for persistent insomnia complaints.  07/08/2021 appointment with the following noted: Change in jobs.  Using Xanax 3-4 times per week to weekly.  New stress.  Quetiapine helps.  Job further away.  Demanding job.  Higher stress in a long time.  Most Xanax 2 of  0.5 mg daily for anxiety and sleep.  Satisfied with meds.   Job function ok.  3 hour commute. Plan: Continue modafinil 200 mg every morning and Seroquel 25 mg tablets 1/2-1 nightly versus alprazolam 0.5 mg 1-2 nightly for persistent insomnia complaints.   01/13/2022 appointment with the following noted: Doing ok.  A lot of stress remains.   Usinng Xanax x 3-4 times per week to weekly for sleep.  Sleep is a little better.  Lesss effective reducing stress than in the past. Anxierty is a little higher than last time but not using more. This is a transition phase drivig to [facility] and decided not to use modafinil bc  fear of higher anxiety.    Patient reports stable mood and denies depressed or irritable moods.    Patient reports some difficulty with sleep initiation or maintenance but better with quetiapine.  Denies appetite disturbance.  Patient reports that energy and motivation have been good. Patient denies any suicidal ideation.  Past Psychiatric Medication Trials: Zoloft 2 days with anxiety, paroxetine 60 sexual SE, Quetiapine hs  trazodone. Concerta to 36 am and Guanfacine to 3mg .  But that made him sleepy.  He flet even the Concerta alone made  Him lethargic.  Lost benefit anyway.  Review of Systems:  Review of Systems  Respiratory:  Negative for shortness of breath.        SOB long term and runs in the family  Cardiovascular:  Negative for palpitations.  Gastrointestinal:  Negative for constipation.  Neurological:  Negative for tremors and weakness.  Psychiatric/Behavioral:  Positive for decreased concentration and sleep disturbance. Negative for agitation, behavioral problems, confusion, dysphoric mood, hallucinations, self-injury and suicidal ideas. The patient is nervous/anxious. The patient is not hyperactive.     Medications: I have reviewed the patient's current medications.  Current Outpatient Medications  Medication Sig Dispense Refill   Acetylcysteine 600 MG CAPS Take 600 mg by mouth daily.     lisinopril (PRINIVIL,ZESTRIL) 10 MG tablet Take 10 mg by mouth daily.     Multiple Vitamin (MULTIVITAMIN) capsule Take by mouth.     Omega-3 Fatty Acids (FISH OIL) 1000 MG CAPS Take 2,000 mg by mouth daily.     simvastatin (ZOCOR) 20 MG tablet Take 20 mg by mouth daily at 6 PM.     ALPRAZolam (XANAX) 0.5 MG tablet TAKE 1 TABLET (0.5 MG) BY MOUTH 3 TIMES A DAY AS NEEDED FOR ANXIETY 60 tablet 5   modafinil (PROVIGIL) 200 MG tablet Take 1 tablet (200 mg total) by mouth daily. (Patient not taking: Reported on 01/13/2022) 30 tablet 2   QUEtiapine (SEROQUEL) 25 MG tablet Take 1 tablet (25 mg  total) by mouth at bedtime. 90 tablet 1   No current facility-administered medications for this visit.    Medication Side Effects: Other: weight gain  Allergies: No Known Allergies  History reviewed. No pertinent past medical history.  Family History  Problem Relation Age of Onset   OCD Mother    Anxiety disorder Father     Social History   Socioeconomic History   Marital status: Married    Spouse name: Not on file   Number of children: Not on file   Years of education: Not on file   Highest education level: Not on file  Occupational History   Not on file  Tobacco Use   Smoking status: Never   Smokeless tobacco: Never  Substance and Sexual Activity   Alcohol use: Not Currently   Drug use: Never  Sexual activity: Yes    Partners: Female    Birth control/protection: None  Other Topics Concern   Not on file  Social History Narrative   Not on file   Social Determinants of Health   Financial Resource Strain: Not on file  Food Insecurity: Not on file  Transportation Needs: Not on file  Physical Activity: Not on file  Stress: Not on file  Social Connections: Not on file  Intimate Partner Violence: Not on file    Past Medical History, Surgical history, Social history, and Family history were reviewed and updated as appropriate.   Adult Self Report Scale Inattention=24 and hyperactivity=30  Total 54 which is very high  Please see review of systems for further details on the patient's review from today.   Objective:   Physical Exam:  There were no vitals taken for this visit.  Physical Exam Constitutional:      General: He is not in acute distress. Musculoskeletal:        General: No deformity.  Neurological:     Mental Status: He is alert and oriented to person, place, and time.  Psychiatric:        Attention and Perception: Perception normal. He is attentive.        Mood and Affect: Mood is anxious. Mood is not depressed. Affect is not labile, blunt,  angry or inappropriate.        Speech: Speech normal.        Behavior: Behavior normal.        Thought Content: Thought content normal. Thought content is not paranoid. Thought content does not include homicidal or suicidal ideation.        Cognition and Memory: Cognition normal.        Judgment: Judgment normal. Judgment is not inappropriate.     Comments: Insight is good. Residual OCD unchanged but OK Intermittent anxiety.  Some degree of chronic stress but manageable.  Not depressed     Lab Review:  No results found for: "NA", "K", "CL", "CO2", "GLUCOSE", "BUN", "CREATININE", "CALCIUM", "PROT", "ALBUMIN", "AST", "ALT", "ALKPHOS", "BILITOT", "GFRNONAA", "GFRAA"  No results found for: "WBC", "RBC", "HGB", "HCT", "PLT", "MCV", "MCH", "MCHC", "RDW", "LYMPHSABS", "MONOABS", "EOSABS", "BASOSABS"  No results found for: "POCLITH", "LITHIUM"   No results found for: "PHENYTOIN", "PHENOBARB", "VALPROATE", "CBMZ"   .res Assessment: Plan:    [place] was seen today for follow-up, adhd, anxiety and stress.  Diagnoses and all orders for this visit:  Mixed obsessional thoughts and acts -     ALPRAZolam (XANAX) 0.5 MG tablet; TAKE 1 TABLET (0.5 MG) BY MOUTH 3 TIMES A DAY AS NEEDED FOR ANXIETY  Attention deficit hyperactivity disorder (ADHD), combined type  Primary insomnia -     QUEtiapine (SEROQUEL) 25 MG tablet; Take 1 tablet (25 mg total) by mouth at bedtime.   Greater than 50% of 30 min face to face time with patient was spent on counseling and coordination of care. We discussed At this time we are not treating the ADHD except with the use of modafinil which she is using as needed  Discussed the alternative of an atypical stimulant such as modafinil.  Discussed the side effects and that it is a controlled substance.  He thinks modafinil 200 mg every morning for ADD symptoms off label was somewhat helpful.  Discussed good Rx and pricing as his insurance may not cover it. Disc importance of  protecting sleep to help daytime function.  Maybe still a little short on hours.  He feels the NAC 2000mg  daily has helped improve the OCD.    Disc dx chronic insomnia  OK continue Xanax prn for sleep instead of Ativan. Vs Seroquel 25 mg . We discussed the short-term risks associated with benzodiazepines including sedation and increased fall risk among others.  Discussed long-term side effect risk including dependence, potential withdrawal symptoms, and the potential eventual dose-related risk of dementia.  But recent studies from 2020 dispute this association between benzodiazepines and dementia risk. Newer studies in 2020 do not support an association with dementia. 2021  Answered questions and given some reassurance about dosage he's taking and what makes meds a controlled substance including the stimulant and the BZ.   Risk dependency low with use les s than 50%% days  Seroquel 25 mg tablets 1/2-1 nightly versus alprazolam 0.5 mg 1-2 nightly for persistent insomnia complaints.  Has residual un managed anxiety mainly with work.  Follow-up 4-6 mos.    Marland Kitchen, MD, DFAPA  Please see After Visit Summary for patient specific instructions.  No future appointments.  No orders of the defined types were placed in this encounter.     -------------------------------

## 2022-03-22 ENCOUNTER — Other Ambulatory Visit: Payer: Self-pay | Admitting: Psychiatry

## 2022-03-22 DIAGNOSIS — F902 Attention-deficit hyperactivity disorder, combined type: Secondary | ICD-10-CM

## 2022-03-24 ENCOUNTER — Other Ambulatory Visit: Payer: Self-pay | Admitting: Psychiatry

## 2022-03-24 DIAGNOSIS — F902 Attention-deficit hyperactivity disorder, combined type: Secondary | ICD-10-CM

## 2022-03-29 ENCOUNTER — Other Ambulatory Visit: Payer: Self-pay | Admitting: Psychiatry

## 2022-03-29 DIAGNOSIS — F902 Attention-deficit hyperactivity disorder, combined type: Secondary | ICD-10-CM

## 2022-07-26 ENCOUNTER — Ambulatory Visit (INDEPENDENT_AMBULATORY_CARE_PROVIDER_SITE_OTHER): Payer: 59 | Admitting: Psychiatry

## 2022-07-26 ENCOUNTER — Encounter: Payer: Self-pay | Admitting: Psychiatry

## 2022-07-26 DIAGNOSIS — F902 Attention-deficit hyperactivity disorder, combined type: Secondary | ICD-10-CM

## 2022-07-26 DIAGNOSIS — F5101 Primary insomnia: Secondary | ICD-10-CM

## 2022-07-26 DIAGNOSIS — F422 Mixed obsessional thoughts and acts: Secondary | ICD-10-CM | POA: Diagnosis not present

## 2022-07-26 MED ORDER — ALPRAZOLAM 0.5 MG PO TABS: ORAL_TABLET | ORAL | 5 refills | Status: DC

## 2022-07-26 MED ORDER — QUETIAPINE FUMARATE 25 MG PO TABS
25.0000 mg | ORAL_TABLET | Freq: Every day | ORAL | 1 refills | Status: DC
Start: 2022-07-26 — End: 2022-11-10

## 2022-07-26 MED ORDER — MODAFINIL 200 MG PO TABS: 200.0000 mg | ORAL_TABLET | Freq: Every day | ORAL | 1 refills | Status: DC

## 2022-07-26 NOTE — Progress Notes (Signed)
Mark Green [id number] 17-Jul-1971 52 y.o.    Subjective:   Patient ID:  Mark Green is a 52 y.o. (DOB January 17, 1971) male.  Chief Complaint:  Chief Complaint  Patient presents with   Follow-up    Mixed obsessional thoughts and acts   ADHD    HPI [place] presents to the office today for follow-up of OCD and ADD and insomnia.  At visit August 31, 2018 his OCD and ADD symptoms were not under as good control as he would like.  His preference was to focus on the ADD symptoms and he started Concerta 27 mg every morning and guanfacine 0.5 mg for 1 week to increase to 1 mg nightly.  Also reduced the fluvoxamine to 200mg .  seen September 2020. Changed Xanax for sleep instead of Ativan but usually uses about 1 1/2 tablets weekly.  Overall sleep is better.Concern over the antidepressant potentially elevating glucose and stopped it 2-3 weeks ago.  Weaned himself off of it.  Doesn't think it did that much anyway.  No problems with it so far. He's done a lot of research and wants to use low dose stimulant and low dose non stimulant to help ADD and impuslivity without increaseing the anxiety.  This has been partially beneficial.  Challenging 4 mos ago lost his job.  New job starts soon.  In January asked to restart fluvoxamine out of concern over the stress.  Been taking average for 1/2 alprazolam daily.  Anxiety better with job offer.  Had worried to the point he's tired of worrying and let it go.  Handled it better than in the past.  Not markedly depressed now.   SLeep is not that great.   F had sleep problems and Rx low dose Seroquel.  Wondered about it and took father's for 2 days.  Tried 1/2 of 25 mg Seroquel and it helped sleep. Had some hangover.   It worked better than 0.5 mg Xanax.  01/17/20 appt the following noted: Quetiapine gave hangover at 25 mg. Last month more anxity and taking Xanax more several days per week.  Not exceeding amount.  Careful over  avoiding dependence.  Works when taken. Average 6 and 1//2 hours sleep.  Some daytime sleepiness. A little caffeine.  Only 100 mg Luvox. OCD under control mostly.  Can get scared if alone at night but otherwise OK.   Rare use modafinil helped sleepiness and tolerated. Good work function. Plan: Okay for him to try Seroquel 25 mg tablets 1/2-1 nightly versus alprazolam 0.5 mg 1-2 nightly for persistent insomnia complaints.  01/08/2021 appointment with the following noted: Stopped all psych meds mos ago. Doesn't remember taking quetiapine.Wants to rettry it. Done ok except sleep is the main problem. Still problems with attention.  Modafinil was used prn and was helpful.  Doesn't want to get hooked on anything but wants to continue it. Tends to having racing thoughts and distracteed at night to sleep.  Hygiene can be poor DT routine. Better tolerance of caffeine off fluvoxamine.  Anxiety not worse off it.  Anxieity ok. Plan; Okay for him to retry Seroquel 25 mg tablets 1/2-1 nightly versus alprazolam 0.5 mg 1-2 nightly for persistent insomnia complaints.  07/08/2021 appointment with the following noted: Change in jobs.  Using Xanax 3-4 times per week to weekly.  New stress.  Quetiapine helps.  Job further away.  Demanding job.  Higher stress in a long time.  Most Xanax 2 of 0.5 mg daily for anxiety and  sleep.  Satisfied with meds.   Job function ok.  3 hour commute. Plan: Continue modafinil 200 mg every morning and Seroquel 25 mg tablets 1/2-1 nightly versus alprazolam 0.5 mg 1-2 nightly for persistent insomnia complaints.   01/13/2022 appointment with the following noted: Doing ok.  A lot of stress remains.   Usinng Xanax x 3-4 times per week to weekly for sleep.  Sleep is a little better.  Lesss effective reducing stress than in the past. Anxierty is a little higher than last time but not using more. This is a transition phase drivig to [facility] and decided not to use modafinil bc fear of  higher anxiety.   Plan: Seroquel 25 mg tablets 1/2-1 nightly versus alprazolam 0.5 mg 1-2 nightly for persistent insomnia complaints.  07/26/22 appt noted: Overall ok.  Some compulive watching videos but not anxiety driven.    Makes him mentally tired.  OCD managed Taking quetiapine at night for sleep.  Average one of the 0.5mg  alprazolam daily. Sleep is OK overall. Mood is ok. Taking modafinil 200 mg prn and it works well for alertness. No sig SE. Anxiety is high generall DT stress.  Handling it well.  Trying to minimize Xanax. Patient reports stable mood and denies depressed or irritable moods.    Patient reports some difficulty with sleep initiation or maintenance but better with quetiapine.  Denies appetite disturbance.  Patient reports that energy and motivation have been good. Patient denies any suicidal ideation.  Past Psychiatric Medication Trials: Zoloft 2 days with anxiety, paroxetine 60 sexual SE, Quetiapine hs  trazodone. Concerta to 36 am and Guanfacine to 3mg .  But that made him sleepy.  He flet even the Concerta alone made  Him lethargic.  Lost benefit anyway.  Review of Systems:  Review of Systems  Respiratory:  Negative for shortness of breath.        SOB long term and runs in the family  Cardiovascular:  Negative for palpitations.  Gastrointestinal:  Negative for constipation.  Neurological:  Negative for tremors and weakness.  Psychiatric/Behavioral:  Positive for decreased concentration and sleep disturbance. Negative for agitation, behavioral problems, confusion, dysphoric mood, hallucinations, self-injury and suicidal ideas. The patient is nervous/anxious. The patient is not hyperactive.     Medications: I have reviewed the patient's current medications.  Current Outpatient Medications  Medication Sig Dispense Refill   Acetylcysteine 600 MG CAPS Take 600 mg by mouth daily.     lisinopril (PRINIVIL,ZESTRIL) 10 MG tablet Take 10 mg by mouth daily.     Multiple  Vitamin (MULTIVITAMIN) capsule Take by mouth.     Omega-3 Fatty Acids (FISH OIL) 1000 MG CAPS Take 2,000 mg by mouth daily.     simvastatin (ZOCOR) 20 MG tablet Take 20 mg by mouth daily at 6 PM.     ALPRAZolam (XANAX) 0.5 MG tablet TAKE 1 TABLET (0.5 MG) BY MOUTH 3 TIMES A DAY AS NEEDED FOR ANXIETY 60 tablet 5   modafinil (PROVIGIL) 200 MG tablet Take 1 tablet (200 mg total) by mouth daily. 90 tablet 1   QUEtiapine (SEROQUEL) 25 MG tablet Take 1 tablet (25 mg total) by mouth at bedtime. 90 tablet 1   No current facility-administered medications for this visit.    Medication Side Effects: Other: weight gain  Allergies: No Known Allergies  History reviewed. No pertinent past medical history.  Family History  Problem Relation Age of Onset   OCD Mother    Anxiety disorder Father     Social  History   Socioeconomic History   Marital status: Married    Spouse name: Not on file   Number of children: Not on file   Years of education: Not on file   Highest education level: Not on file  Occupational History   Not on file  Tobacco Use   Smoking status: Never   Smokeless tobacco: Never  Substance and Sexual Activity   Alcohol use: Not Currently   Drug use: Never   Sexual activity: Yes    Partners: Female    Birth control/protection: None  Other Topics Concern   Not on file  Social History Narrative   Not on file   Social Determinants of Health   Financial Resource Strain: Not on file  Food Insecurity: Not on file  Transportation Needs: Not on file  Physical Activity: Not on file  Stress: Not on file  Social Connections: Not on file  Intimate Partner Violence: Not on file    Past Medical History, Surgical history, Social history, and Family history were reviewed and updated as appropriate.   Adult Self Report Scale Inattention=24 and hyperactivity=30  Total 54 which is very high  Please see review of systems for further details on the patient's review from today.    Objective:   Physical Exam:  There were no vitals taken for this visit.  Physical Exam Constitutional:      General: He is not in acute distress. Musculoskeletal:        General: No deformity.  Neurological:     Mental Status: He is alert and oriented to person, place, and time.  Psychiatric:        Attention and Perception: Perception normal. He is attentive.        Mood and Affect: Mood is anxious. Mood is not depressed. Affect is not labile, blunt, angry or inappropriate.        Speech: Speech normal.        Behavior: Behavior normal.        Thought Content: Thought content normal. Thought content is not paranoid or delusional. Thought content does not include homicidal or suicidal ideation.        Cognition and Memory: Cognition normal.        Judgment: Judgment normal. Judgment is not inappropriate.     Comments: Insight is good. Residual OCD unchanged but OK Intermittent anxiety.  Some degree of chronic stress but manageable.  Not depressed     Lab Review:  No results found for: "NA", "K", "CL", "CO2", "GLUCOSE", "BUN", "CREATININE", "CALCIUM", "PROT", "ALBUMIN", "AST", "ALT", "ALKPHOS", "BILITOT", "GFRNONAA", "GFRAA"  No results found for: "WBC", "RBC", "HGB", "HCT", "PLT", "MCV", "MCH", "MCHC", "RDW", "LYMPHSABS", "MONOABS", "EOSABS", "BASOSABS"  No results found for: "POCLITH", "LITHIUM"   No results found for: "PHENYTOIN", "PHENOBARB", "VALPROATE", "CBMZ"   .res Assessment: Plan:    [place] was seen today for follow-up and adhd.  Diagnoses and all orders for this visit:  Mixed obsessional thoughts and acts -     ALPRAZolam (XANAX) 0.5 MG tablet; TAKE 1 TABLET (0.5 MG) BY MOUTH 3 TIMES A DAY AS NEEDED FOR ANXIETY  Attention deficit hyperactivity disorder (ADHD), combined type -     modafinil (PROVIGIL) 200 MG tablet; Take 1 tablet (200 mg total) by mouth daily.  Primary insomnia -     QUEtiapine (SEROQUEL) 25 MG tablet; Take 1 tablet (25 mg total) by mouth  at bedtime.   Greater than 50% of 30 min face to face time with patient was  spent on counseling and coordination of care. We discussed At this time we are not treating the ADHD except with the use of modafinil which she is using as needed  Discussed the alternative of an atypical stimulant such as modafinil.  Discussed the side effects and that it is a controlled substance.   He thinks modafinil 200 mg every morning for ADD symptoms off label was somewhat helpful.  Discussed good Rx and pricing as his insurance may not cover it. Disc importance of protecting sleep to help daytime function.  Maybe still a little short on hours.   He feels the NAC 2000mg  daily has helped improve the OCD.    Disc dx chronic insomnia  OK continue Xanax prn for sleep instead of Ativan. Vs Seroquel 25 mg . We discussed the short-term risks associated with benzodiazepines including sedation and increased fall risk among others.  Discussed long-term side effect risk including dependence, potential withdrawal symptoms, and the potential eventual dose-related risk of dementia.  But recent studies from 2020 dispute this association between benzodiazepines and dementia risk. Newer studies in 2020 do not support an association with dementia. Marland Kitchen  Answered questions and given some reassurance about dosage he's taking and what makes meds a controlled substance including the stimulant and the BZ.   Risk dependency low with use les s than 50%% days  Answered questions   Seroquel 25 mg tablets 1/2-1 nightly versus alprazolam 0.5 mg 1-2 nightly for persistent insomnia complaints.  No med changes.    Has residual un managed anxiety mainly with work.  Follow-up 6 mos.    Lynder Parents, MD, DFAPA  Please see After Visit Summary for patient specific instructions.  No future appointments.  No orders of the defined types were placed in this encounter.     -------------------------------

## 2022-11-10 ENCOUNTER — Other Ambulatory Visit: Payer: Self-pay | Admitting: Psychiatry

## 2022-11-10 DIAGNOSIS — F5101 Primary insomnia: Secondary | ICD-10-CM

## 2022-11-10 DIAGNOSIS — F422 Mixed obsessional thoughts and acts: Secondary | ICD-10-CM

## 2023-02-23 ENCOUNTER — Ambulatory Visit: Payer: 59 | Admitting: Psychiatry

## 2023-03-29 ENCOUNTER — Other Ambulatory Visit: Payer: Self-pay | Admitting: Psychiatry

## 2023-03-29 DIAGNOSIS — F422 Mixed obsessional thoughts and acts: Secondary | ICD-10-CM

## 2023-04-05 ENCOUNTER — Encounter: Payer: Self-pay | Admitting: Psychiatry

## 2023-04-05 ENCOUNTER — Ambulatory Visit (INDEPENDENT_AMBULATORY_CARE_PROVIDER_SITE_OTHER): Payer: 59 | Admitting: Psychiatry

## 2023-04-05 DIAGNOSIS — F902 Attention-deficit hyperactivity disorder, combined type: Secondary | ICD-10-CM

## 2023-04-05 DIAGNOSIS — F411 Generalized anxiety disorder: Secondary | ICD-10-CM | POA: Diagnosis not present

## 2023-04-05 DIAGNOSIS — F5101 Primary insomnia: Secondary | ICD-10-CM | POA: Diagnosis not present

## 2023-04-05 DIAGNOSIS — F422 Mixed obsessional thoughts and acts: Secondary | ICD-10-CM | POA: Diagnosis not present

## 2023-04-05 MED ORDER — QUETIAPINE FUMARATE 25 MG PO TABS
25.0000 mg | ORAL_TABLET | Freq: Every day | ORAL | 1 refills | Status: DC
Start: 2023-04-05 — End: 2023-10-19

## 2023-04-05 MED ORDER — MODAFINIL 200 MG PO TABS: 200.0000 mg | ORAL_TABLET | Freq: Every day | ORAL | 1 refills | Status: DC

## 2023-04-05 MED ORDER — ALPRAZOLAM 0.5 MG PO TABS: ORAL_TABLET | ORAL | 0 refills | Status: DC

## 2023-04-05 MED ORDER — BUSPIRONE HCL 15 MG PO TABS: ORAL_TABLET | ORAL | 0 refills | Status: DC

## 2023-04-05 NOTE — Patient Instructions (Signed)
Will start BuSpar 15 mg 1/3 tablet twice daily for 1 week, then increase to 2/3 tablet twice daily for 1 week, then increase to 1 tablet twice daily for anxiety.

## 2023-04-05 NOTE — Progress Notes (Signed)
Mark Green [id number] [birth year] 52 y.o.    Subjective:   Patient ID:  Mark Green is a 52 y.o. (DOB 1971/04/25) male.  Chief Complaint:  Chief Complaint  Patient presents with   Follow-up    Mood and anxiety    HPI [facility] [name] presents to the office today for follow-up of OCD and ADD and insomnia.  At visit August 31, 2018 his OCD and ADD symptoms were not under as good control as he would like.  His preference was to focus on the ADD symptoms and he started Concerta 27 mg every morning and guanfacine 0.5 mg for 1 week to increase to 1 mg nightly.  Also reduced the fluvoxamine to 200mg .  seen September 2020. Changed Xanax for sleep instead of Ativan but usually uses about 1 1/2 tablets weekly.  Overall sleep is better.Concern over the antidepressant potentially elevating glucose and stopped it 2-3 weeks ago.  Weaned himself off of it.  Doesn't think it did that much anyway.  No problems with it so far. He's done a lot of research and wants to use low dose stimulant and low dose non stimulant to help ADD and impuslivity without increaseing the anxiety.  This has been partially beneficial.  Challenging 4 mos ago lost his job.  New job starts soon.  In January asked to restart fluvoxamine out of concern over the stress.  Been taking average for 1/2 alprazolam daily.  Anxiety better with job offer.  Had worried to the point he's tired of worrying and let it go.  Handled it better than in the past.  Not markedly depressed now.   SLeep is not that great.   F had sleep problems and Rx low dose Seroquel.  Wondered about it and took father's for 2 days.  Tried 1/2 of 25 mg Seroquel and it helped sleep. Had some hangover.   It worked better than 0.5 mg Xanax.  01/17/20 appt the following noted: Quetiapine gave hangover at 25 mg. Last month more anxity and taking Xanax more several days per week.  Not exceeding amount.  Careful over avoiding dependence.  Works when  taken. Average 6 and 1//2 hours sleep.  Some daytime sleepiness. A little caffeine.  Only 100 mg Luvox. OCD under control mostly.  Can get scared if alone at night but otherwise OK.   Rare use modafinil helped sleepiness and tolerated. Good work function. Plan: Okay for him to try Seroquel 25 mg tablets 1/2-1 nightly versus alprazolam 0.5 mg 1-2 nightly for persistent insomnia complaints.  01/08/2021 appointment with the following noted: Stopped all psych meds mos ago. Doesn't remember taking quetiapine.Wants to rettry it. Done ok except sleep is the main problem. Still problems with attention.  Modafinil was used prn and was helpful.  Doesn't want to get hooked on anything but wants to continue it. Tends to having racing thoughts and distracteed at night to sleep.  Hygiene can be poor DT routine. Better tolerance of caffeine off fluvoxamine.  Anxiety not worse off it.  Anxieity ok. Plan; Okay for him to retry Seroquel 25 mg tablets 1/2-1 nightly versus alprazolam 0.5 mg 1-2 nightly for persistent insomnia complaints.  07/08/2021 appointment with the following noted: Change in jobs.  Using Xanax 3-4 times per week to weekly.  New stress.  Quetiapine helps.  Job further away.  Demanding job.  Higher stress in a long time.  Most Xanax 2 of 0.5 mg daily for anxiety and sleep.  Satisfied with meds.  Job function ok.  3 hour commute. Plan: Continue modafinil 200 mg every morning and Seroquel 25 mg tablets 1/2-1 nightly versus alprazolam 0.5 mg 1-2 nightly for persistent insomnia complaints.   01/13/2022 appointment with the following noted: Doing ok.  A lot of stress remains.   Usinng Xanax x 3-4 times per week to weekly for sleep.  Sleep is a little better.  Lesss effective reducing stress than in the past. Anxierty is a little higher than last time but not using more. This is a transition phase drivig to [facility] and decided not to use modafinil bc fear of higher anxiety.   Plan: Seroquel 25  mg tablets 1/2-1 nightly versus alprazolam 0.5 mg 1-2 nightly for persistent insomnia complaints.  07/26/22 appt noted: Overall ok.  Some compulive watching videos but not anxiety driven.    Makes him mentally tired.  OCD managed Taking quetiapine at night for sleep.  Average one of the 0.5mg  alprazolam daily. Sleep is OK overall. Mood is ok. Taking modafinil 200 mg prn and it works well for alertness. No sig SE. Anxiety is high generall DT stress.  Handling it well.  Trying to minimize Xanax. Patient reports stable mood and denies depressed or irritable moods.    Patient reports some difficulty with sleep initiation or maintenance but better with quetiapine.  Denies appetite disturbance.  Patient reports that energy and motivation have been good. Patient denies any suicidal ideation.  04/05/23 appt noted: Ok overall.  Still sig anxiety.  Trying to manage with Xnax but not daily. Meds: quetiapine 25 HS with or without Xnax.  Modafinil 200 mg . Sleep better than with OTC meds.  But some drowsiness and tiredness the next day but often goes to sleep too late.   Modafinil helps alertness.    Wonders if he has some OCD and gets fixated on breathing.  Obs on not catching breath.     Past Psychiatric Medication Trials: Zoloft 2 days with anxiety, paroxetine 60 sexual SE, Xanax 0.5 mg  Quetiapine hs  trazodone. Concerta to 36 am and Guanfacine to 3mg .  But that made him sleepy.   He felt even the Concerta alone made  Him lethargic.  Lost benefit anyway.  Review of Systems:  Review of Systems  Respiratory:  Negative for shortness of breath.        SOB long term and runs in the family  Cardiovascular:  Negative for palpitations.  Gastrointestinal:  Negative for constipation.  Neurological:  Negative for tremors and weakness.  Psychiatric/Behavioral:  Positive for decreased concentration and sleep disturbance. Negative for agitation, behavioral problems, confusion, dysphoric mood,  hallucinations, self-injury and suicidal ideas. The patient is nervous/anxious. The patient is not hyperactive.     Medications: I have reviewed the patient's current medications.  Current Outpatient Medications  Medication Sig Dispense Refill   Acetylcysteine 600 MG CAPS Take 600 mg by mouth daily.     busPIRone (BUSPAR) 15 MG tablet Take 1/3 tablet p.o. twice daily for 1 week, then take 2/3 tablet p.o. twice daily for 1 week, then take 1 tablet p.o. twice daily 180 tablet 0   lisinopril (PRINIVIL,ZESTRIL) 10 MG tablet Take 10 mg by mouth daily.     Multiple Vitamin (MULTIVITAMIN) capsule Take by mouth.     Omega-3 Fatty Acids (FISH OIL) 1000 MG CAPS Take 2,000 mg by mouth daily.     simvastatin (ZOCOR) 20 MG tablet Take 20 mg by mouth daily at 6 PM.     ALPRAZolam (  XANAX) 0.5 MG tablet TAKE 1 TABLET (0.5 MG) BY MOUTH 3 TIMES A DAY AS NEEDED FOR ANXIETY 60 tablet 0   modafinil (PROVIGIL) 200 MG tablet Take 1 tablet (200 mg total) by mouth daily. 90 tablet 1   QUEtiapine (SEROQUEL) 25 MG tablet Take 1 tablet (25 mg total) by mouth at bedtime. 90 tablet 1   No current facility-administered medications for this visit.    Medication Side Effects: Other: weight gain  Allergies: No Known Allergies  History reviewed. No pertinent past medical history.  Family History  Problem Relation Age of Onset   OCD Mother    Anxiety disorder Father     Social History   Socioeconomic History   Marital status: Married    Spouse name: Not on file   Number of children: Not on file   Years of education: Not on file   Highest education level: Not on file  Occupational History   Not on file  Tobacco Use   Smoking status: Never   Smokeless tobacco: Never  Substance and Sexual Activity   Alcohol use: Not Currently   Drug use: Never   Sexual activity: Yes    Partners: Female    Birth control/protection: None  Other Topics Concern   Not on file  Social History Narrative   Not on file    Social Determinants of Health   Financial Resource Strain: Not on file  Food Insecurity: Not on file  Transportation Needs: Not on file  Physical Activity: Not on file  Stress: Not on file  Social Connections: Unknown (12/06/2021)   Received from [facility], [facility]   Social Network    Social Network: Not on file  Intimate Partner Violence: Unknown (10/28/2021)   Received from [facility], [facility]   HITS    Physically Hurt: Not on file    Insult or Talk Down To: Not on file    Threaten Physical Harm: Not on file    Scream or Curse: Not on file    Past Medical History, Surgical history, Social history, and Family history were reviewed and updated as appropriate.   Adult Self Report Scale Inattention=24 and hyperactivity=30  Total 54 which is very high  Please see review of systems for further details on the patient's review from today.   Objective:   Physical Exam:  There were no vitals taken for this visit.  Physical Exam Constitutional:      General: He is not in acute distress. Musculoskeletal:        General: No deformity.  Neurological:     Mental Status: He is alert and oriented to person, place, and time.  Psychiatric:        Attention and Perception: Perception normal. He is attentive.        Mood and Affect: Mood is anxious. Mood is not depressed. Affect is not labile, blunt, angry or inappropriate.        Speech: Speech normal.        Behavior: Behavior normal.        Thought Content: Thought content normal. Thought content is not paranoid or delusional. Thought content does not include homicidal or suicidal ideation.        Cognition and Memory: Cognition normal.        Judgment: Judgment normal. Judgment is not inappropriate.     Comments: Insight is good. Residual OCD unchanged but OK Intermittent anxiety.  Some degree of chronic stress but manageable.  Not depressed  Lab Review:  No results found for: "NA", "K", "CL", "CO2",  "GLUCOSE", "BUN", "CREATININE", "CALCIUM", "PROT", "ALBUMIN", "AST", "ALT", "ALKPHOS", "BILITOT", "GFRNONAA", "GFRAA"  No results found for: "WBC", "RBC", "HGB", "HCT", "PLT", "MCV", "MCH", "MCHC", "RDW", "LYMPHSABS", "MONOABS", "EOSABS", "BASOSABS"  No results found for: "POCLITH", "LITHIUM"   No results found for: "PHENYTOIN", "PHENOBARB", "VALPROATE", "CBMZ"   .res Assessment: Plan:    Ezaiah was seen today for follow-up.  Diagnoses and all orders for this visit:  Generalized anxiety disorder -     busPIRone (BUSPAR) 15 MG tablet; Take 1/3 tablet p.o. twice daily for 1 week, then take 2/3 tablet p.o. twice daily for 1 week, then take 1 tablet p.o. twice daily  Mixed obsessional thoughts and acts -     busPIRone (BUSPAR) 15 MG tablet; Take 1/3 tablet p.o. twice daily for 1 week, then take 2/3 tablet p.o. twice daily for 1 week, then take 1 tablet p.o. twice daily -     ALPRAZolam (XANAX) 0.5 MG tablet; TAKE 1 TABLET (0.5 MG) BY MOUTH 3 TIMES A DAY AS NEEDED FOR ANXIETY  Attention deficit hyperactivity disorder (ADHD), combined type -     modafinil (PROVIGIL) 200 MG tablet; Take 1 tablet (200 mg total) by mouth daily.  Primary insomnia -     QUEtiapine (SEROQUEL) 25 MG tablet; Take 1 tablet (25 mg total) by mouth at bedtime.    30 min face to face time with patient was spent on counseling and coordination of care. We discussed At this time we are not treating the ADHD except with the use of modafinil which she is using as needed  Discussed the alternative of an atypical stimulant such as modafinil.  Discussed the side effects and that it is a controlled substance.   He thinks modafinil 200 mg every morning for ADD symptoms off label was somewhat helpful.  Discussed good Rx and pricing as his insurance may not cover it. Disc importance of protecting sleep to help daytime function.  Maybe still a little short on hours.   He feels the NAC 2000mg  daily has helped improve the OCD.     Disc dx chronic insomnia  OK continue Xanax prn for sleep instead of Ativan. Vs Seroquel 25 mg . We discussed the short-term risks associated with benzodiazepines including sedation and increased fall risk among others.  Discussed long-term side effect risk including dependence, potential withdrawal symptoms, and the potential eventual dose-related risk of dementia.  But recent studies from 2020 dispute this association between benzodiazepines and dementia risk. Newer studies in 2020 do not support an association with dementia. Previously Answered questions and given some reassurance about dosage he's taking and what makes meds a controlled substance including the stimulant and the BZ.   Risk dependency low with use les s than 50%% days  Answered questions   Seroquel 25 mg tablets 1/2-1 nightly versus alprazolam 0.5 mg 1-2 nightly for persistent insomnia complaints.  Buspirone Discussed potential benefits, risks, and side effects of BuSpar.  Patient agrees to trial of BuSpar.  Will start BuSpar 15 mg 1/3 tablet twice daily for 1 week, then increase to 2/3 tablet twice daily for 1 week, then increase to 1 tablet twice daily for anxiety.  Has residual un managed anxiety mainly with work.  Consider retrying other SSRI like Lexapro.  Follow-up 3 mos.    Meredith Staggers, MD, DFAPA  Please see After Visit Summary for patient specific instructions.  No future appointments.  No orders of the defined  types were placed in this encounter.     -------------------------------

## 2023-06-13 ENCOUNTER — Ambulatory Visit: Payer: 59 | Admitting: Psychiatry

## 2023-07-01 ENCOUNTER — Other Ambulatory Visit: Payer: Self-pay | Admitting: Psychiatry

## 2023-07-01 DIAGNOSIS — F422 Mixed obsessional thoughts and acts: Secondary | ICD-10-CM

## 2023-07-01 DIAGNOSIS — F411 Generalized anxiety disorder: Secondary | ICD-10-CM

## 2023-08-24 ENCOUNTER — Ambulatory Visit: Payer: 59 | Admitting: Psychiatry

## 2023-10-19 ENCOUNTER — Encounter: Payer: Self-pay | Admitting: Psychiatry

## 2023-10-19 ENCOUNTER — Ambulatory Visit: Payer: 59 | Admitting: Psychiatry

## 2023-10-19 DIAGNOSIS — F902 Attention-deficit hyperactivity disorder, combined type: Secondary | ICD-10-CM | POA: Diagnosis not present

## 2023-10-19 DIAGNOSIS — F5101 Primary insomnia: Secondary | ICD-10-CM | POA: Diagnosis not present

## 2023-10-19 DIAGNOSIS — F411 Generalized anxiety disorder: Secondary | ICD-10-CM | POA: Diagnosis not present

## 2023-10-19 DIAGNOSIS — F422 Mixed obsessional thoughts and acts: Secondary | ICD-10-CM | POA: Diagnosis not present

## 2023-10-19 MED ORDER — QUETIAPINE FUMARATE 25 MG PO TABS: 25.0000 mg | ORAL_TABLET | Freq: Every day | ORAL | 1 refills | Status: DC

## 2023-10-19 MED ORDER — CLONAZEPAM 0.5 MG PO TABS: ORAL_TABLET | ORAL | 0 refills | Status: DC

## 2023-10-19 NOTE — Progress Notes (Signed)
 Mark Green [id number] Feb 16, 1971 53 y.o.    Subjective:   Patient ID:  Mark Green is a 53 y.o. (DOB [birth year]) male.  Chief Complaint:  Chief Complaint  Patient presents with   Follow-up   Anxiety   Sleeping Problem   Stress    HPI [facility] Mark Green presents to the office today for follow-up of OCD and ADD and insomnia.  At visit August 31, 2018 his OCD and ADD symptoms were not under as good control as he would like.  His preference was to focus on the ADD symptoms and he started Concerta 27 mg every morning and guanfacine 0.5 mg for 1 week to increase to 1 mg nightly.  Also reduced the fluvoxamine to 200mg .  seen September 2020. Changed Xanax for sleep instead of Ativan but usually uses about 1 1/2 tablets weekly.  Overall sleep is better.Concern over the antidepressant potentially elevating glucose and stopped it 2-3 weeks ago.  Weaned himself off of it.  Doesn't think it did that much anyway.  No problems with it so far. He's done a lot of research and wants to use low dose stimulant and low dose non stimulant to help ADD and impuslivity without increaseing the anxiety.  This has been partially beneficial. Challenging 4 mos ago lost his job.  New job starts soon.  In January asked to restart fluvoxamine out of concern over the stress.  Been taking average for 1/2 alprazolam daily.  Anxiety better with job offer.  Had worried to the point he's tired of worrying and let it go.  Handled it better than in the past.  Not markedly depressed now.   SLeep is not that great.  F had sleep problems and Rx low dose Seroquel.  Wondered about it and took father's for 2 days.  Tried 1/2 of 25 mg Seroquel and it helped sleep. Had some hangover.   It worked better than 0.5 mg Xanax.  01/17/20 appt the following noted: Quetiapine gave hangover at 25 mg. Last month more anxity and taking Xanax more several days per week.  Not exceeding amount.  Careful over avoiding  dependence.  Works when taken. Average 6 and 1//2 hours sleep.  Some daytime sleepiness. A little caffeine.  Only 100 mg Luvox. OCD under control mostly.  Can get scared if alone at night but otherwise OK.   Rare use modafinil helped sleepiness and tolerated. Good work function. Plan: Okay for him to try Seroquel 25 mg tablets 1/2-1 nightly versus alprazolam 0.5 mg 1-2 nightly for persistent insomnia complaints.  01/08/2021 appointment with the following noted: Stopped all psych meds mos ago. Doesn't remember taking quetiapine.Wants to rettry it. Done ok except sleep is the main problem. Still problems with attention.  Modafinil was used prn and was helpful.  Doesn't want to get hooked on anything but wants to continue it. Tends to having racing thoughts and distracteed at night to sleep.  Hygiene can be poor DT routine. Better tolerance of caffeine off fluvoxamine.  Anxiety not worse off it.  Anxieity ok. Plan; Okay for him to retry Seroquel 25 mg tablets 1/2-1 nightly versus alprazolam 0.5 mg 1-2 nightly for persistent insomnia complaints.  07/08/2021 appointment with the following noted: Change in jobs.  Using Xanax 3-4 times per week to weekly.  New stress.  Quetiapine helps.  Job further away.  Demanding job.  Higher stress in a long time.  Most Xanax 2 of 0.5 mg daily for anxiety and sleep.  Satisfied  with meds.   Job function ok.  3 hour commute. Plan: Continue modafinil 200 mg every morning and Seroquel 25 mg tablets 1/2-1 nightly versus alprazolam 0.5 mg 1-2 nightly for persistent insomnia complaints.   01/13/2022 appointment with the following noted: Doing ok.  A lot of stress remains.   Usinng Xanax x 3-4 times per week to weekly for sleep.  Sleep is a little better.  Lesss effective reducing stress than in the past. Anxierty is a little higher than last time but not using more. This is a transition phase drivig to [facility] and decided not to use modafinil bc fear of higher  anxiety.   Plan: Seroquel 25 mg tablets 1/2-1 nightly versus alprazolam 0.5 mg 1-2 nightly for persistent insomnia complaints.  07/26/22 appt noted: Overall ok.  Some compulive watching videos but not anxiety driven.    Makes him mentally tired.  OCD managed Taking quetiapine at night for sleep.  Average one of the 0.5mg  alprazolam daily. Sleep is OK overall. Mood is ok. Taking modafinil 200 mg prn and it works well for alertness. No sig SE. Anxiety is high generall DT stress.  Handling it well.  Trying to minimize Xanax. Patient reports stable mood and denies depressed or irritable moods.    Patient reports some difficulty with sleep initiation or maintenance but better with quetiapine.  Denies appetite disturbance.  Patient reports that energy and motivation have been good. Patient denies any suicidal ideation.  04/05/23 appt noted: Ok overall.  Still sig anxiety.  Trying to manage with Xnax but not daily. Meds: quetiapine 25 HS with or without Xnax.  Modafinil 200 mg . Sleep better than with OTC meds.  But some drowsiness and tiredness the next day but often goes to sleep too late.   Modafinil helps alertness.    Wonders if he has some OCD and gets fixated on breathing.  Obs on not catching breath.   Plan for anxiety:  Buspirone   10/19/23 appt noted: Med: prn Modafinil 200 every morning, quetiapine 25 nightly, no buspirone 15 twice daily, alprazolam 0.5 3 times daily as needed anxiety, NAC recommended Dont' see as much impact from modafinil.    Periods of less stress but lately more stress.  A lot of pressure at work.  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  Stressful on the mind.  Not easy to adapt to this type of job.   Tries to exercise a little out of work. Buspirone didn't work out but Nurse, learning disability.  Quetiapine helps sleep.  But not perfect.  Hard to wind  down.   Anxiety is more of a problem for him now than ADD he thinks. Contributes to sense of SOB.  Alprazolam works a little.   Past Psychiatric Medication Trials: Zoloft 2 days with anxiety, paroxetine 60 sexual SE, Xanax 0.5 mg  Quetiapine hs  trazodone. Buspirone didn't work out but Nurse, learning disability.  Concerta to 36 am  Guanfacine to 3mg .  But that made him sleepy.   He felt even the Concerta alone made  Him lethargic.  Lost benefit anyway.  Review of Systems:  Review of Systems  Respiratory:  Negative for shortness of breath.        SOB long term and runs in the family  Gastrointestinal:  Negative for constipation.  Neurological:  Negative for tremors and weakness.  Psychiatric/Behavioral:  Positive for decreased concentration and sleep disturbance. Negative for agitation, behavioral problems, confusion, dysphoric mood, hallucinations, self-injury and suicidal ideas. The patient is nervous/anxious. The patient is not hyperactive.     Medications: I have reviewed the patient's current medications.  Current Outpatient Medications  Medication Sig Dispense Refill   Acetylcysteine 600 MG CAPS Take 600 mg by mouth daily.     clonazePAM (KLONOPIN) 0.5 MG tablet 1-2 tablets twice daily as needed for anxiety 100 tablet 0   lisinopril (PRINIVIL,ZESTRIL) 10 MG tablet Take 10 mg by mouth daily.     Multiple Vitamin (MULTIVITAMIN) capsule Take by mouth.     Omega-3 Fatty Acids (FISH OIL) 1000 MG CAPS Take 2,000 mg by mouth daily.     simvastatin (ZOCOR) 20 MG tablet Take 20 mg by mouth daily at 6 PM.     modafinil (PROVIGIL) 200 MG tablet Take 1 tablet (200 mg total) by mouth daily. (Patient not taking: Reported on 10/19/2023) 90 tablet 1   QUEtiapine (SEROQUEL) 25 MG tablet Take 1 tablet (25 mg total) by mouth at bedtime. 90 tablet 1   No current facility-administered medications for this visit.    Medication Side Effects: Other: weight gain  Allergies: No Known Allergies  History  reviewed. No pertinent past medical history.  Family History  Problem Relation Age of Onset   OCD Mother    Anxiety disorder Father     Past Medical History, Surgical history, Social history, and Family history were reviewed and updated as appropriate.   Adult Self Report Scale Inattention=24 and hyperactivity=30  Total 54 which is very high  Please see review of systems for further details on the patient's review from today.   Objective:   Physical Exam:  There were no vitals taken for this visit.  Physical Exam Constitutional:      General: He is not in acute distress. Musculoskeletal:        General: No deformity.  Neurological:     Mental Status: He is alert and oriented to person, place, and time.  Psychiatric:        Attention and Perception: Perception normal. He is attentive.        Mood and Affect: Mood is anxious. Mood is not depressed. Affect is not labile, blunt, angry or inappropriate.        Speech: Speech normal.        Behavior: Behavior normal.        Thought Content: Thought content normal. Thought content is not paranoid or delusional. Thought content does not include homicidal or suicidal ideation.        Cognition and Memory: Cognition normal.        Judgment: Judgment normal. Judgment is not inappropriate.     Comments: Insight is good. Residual OCD unchanged but OK Intermittent anxiety.  Some degree of chronic stress but manageable.  Not depressed     Lab Review:  No results found for: "NA", "K", "CL", "CO2", "GLUCOSE", "BUN", "CREATININE", "CALCIUM", "PROT", "ALBUMIN", "AST", "ALT", "ALKPHOS", "BILITOT", "GFRNONAA", "GFRAA"  No results found for: "WBC", "RBC", "HGB", "HCT", "PLT", "MCV", "MCH", "MCHC", "RDW", "LYMPHSABS", "MONOABS", "EOSABS", "BASOSABS"  No results found for: "POCLITH", "LITHIUM"   No results found for: "PHENYTOIN", "PHENOBARB", "VALPROATE", "CBMZ"   .res Assessment: Plan:    Mark Green was seen today for follow-up, anxiety,  sleeping problem and stress.  Diagnoses and all orders for this visit:  Generalized anxiety disorder -     clonazePAM (KLONOPIN) 0.5 MG tablet; 1-2 tablets  twice daily as needed for anxiety  Mixed obsessional thoughts and acts -     clonazePAM (KLONOPIN) 0.5 MG tablet; 1-2 tablets twice daily as needed for anxiety  Attention deficit hyperactivity disorder (ADHD), combined type  Primary insomnia -     QUEtiapine (SEROQUEL) 25 MG tablet; Take 1 tablet (25 mg total) by mouth at bedtime.     30 min face to face time with patient was spent on counseling and coordination of care. We discussed At this time we are not treating the ADHD except with the use of modafinil which she is using as needed  Discussed the alternative of an atypical stimulant such as modafinil.  Discussed the side effects and that it is a controlled substance.   He thinks modafinil 200 mg every morning for ADD symptoms off label was somewhat helpful.  Discussed good Rx and pricing as his insurance may not cover it. Disc importance of protecting sleep to help daytime function.  Maybe still a little short on hours.   He feels the NAC 2000mg  daily has helped improve the OCD.    Disc dx chronic insomnia  OK continue Xanax prn for sleep instead of Ativan. Vs Seroquel 25 mg . We discussed the short-term risks associated with benzodiazepines including sedation and increased fall risk among others.  Discussed long-term side effect risk including dependence, potential withdrawal symptoms, and the potential eventual dose-related risk of dementia.  But recent studies from 2020 dispute this association between benzodiazepines and dementia risk. Newer studies in 2020 do not support an association with dementia. Previously Answered questions and given some reassurance about dosage he's taking and what makes meds a controlled substance including the stimulant and the BZ.  Option swithch to lorazepam or clonazepam.  Chronic SOB with  anxiety Risk dependency low with use les s than 50%% days Rec trial alternative BZ Yes switch to clonazepam 0.5 mg tablet 1-2 tablets twice daily prn to get longer duration  Seroquel 25 mg tablets 1/2-1 nightly versus alprazolam 0.5 mg 1-2 nightly for persistent insomnia complaints.  Option strattera.  Has residual un managed anxiety mainly with work.  Consider retrying other SSRI like Lexapro.  Follow-up 2-3 mos.    Meredith Staggers, MD, DFAPA  Please see After Visit Summary for patient specific instructions.  No future appointments.   No orders of the defined types were placed in this encounter.     -------------------------------

## 2024-01-25 ENCOUNTER — Ambulatory Visit: Admitting: Psychiatry

## 2024-03-03 ENCOUNTER — Other Ambulatory Visit: Payer: Self-pay | Admitting: Psychiatry

## 2024-03-03 DIAGNOSIS — F422 Mixed obsessional thoughts and acts: Secondary | ICD-10-CM

## 2024-03-07 ENCOUNTER — Telehealth: Payer: Self-pay | Admitting: Psychiatry

## 2024-03-07 ENCOUNTER — Other Ambulatory Visit: Payer: Self-pay | Admitting: Psychiatry

## 2024-03-07 DIAGNOSIS — F422 Mixed obsessional thoughts and acts: Secondary | ICD-10-CM

## 2024-03-07 DIAGNOSIS — F902 Attention-deficit hyperactivity disorder, combined type: Secondary | ICD-10-CM

## 2024-03-07 MED ORDER — ALPRAZOLAM 0.5 MG PO TABS: ORAL_TABLET | ORAL | 0 refills | Status: DC

## 2024-03-07 NOTE — Telephone Encounter (Signed)
 Patient called in for refill on Alprazolam  0.5mg . Ph: [phone] Appt 9/17 Pharmacy [facility] [address] [place]

## 2024-03-07 NOTE — Telephone Encounter (Signed)
 Pt requested RF of alprazolam  via phone and modafinil  thru pharmacy interface. Pended both.

## 2024-03-27 ENCOUNTER — Ambulatory Visit: Admitting: Psychiatry

## 2024-04-10 ENCOUNTER — Encounter: Payer: Self-pay | Admitting: Psychiatry

## 2024-04-10 ENCOUNTER — Ambulatory Visit (INDEPENDENT_AMBULATORY_CARE_PROVIDER_SITE_OTHER): Admitting: Psychiatry

## 2024-04-10 DIAGNOSIS — F902 Attention-deficit hyperactivity disorder, combined type: Secondary | ICD-10-CM

## 2024-04-10 DIAGNOSIS — F411 Generalized anxiety disorder: Secondary | ICD-10-CM | POA: Diagnosis not present

## 2024-04-10 DIAGNOSIS — F422 Mixed obsessional thoughts and acts: Secondary | ICD-10-CM | POA: Diagnosis not present

## 2024-04-10 DIAGNOSIS — F5101 Primary insomnia: Secondary | ICD-10-CM

## 2024-04-10 MED ORDER — QUETIAPINE FUMARATE 25 MG PO TABS
25.0000 mg | ORAL_TABLET | Freq: Every day | ORAL | 1 refills | Status: AC
Start: 2024-04-10 — End: ?

## 2024-04-10 MED ORDER — MODAFINIL 200 MG PO TABS: 300.0000 mg | ORAL_TABLET | Freq: Every day | ORAL | 1 refills | Status: AC

## 2024-04-10 MED ORDER — ALPRAZOLAM 0.5 MG PO TABS
ORAL_TABLET | ORAL | 0 refills | Status: AC
Start: 2024-04-10 — End: ?
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
## 2024-10-10 ENCOUNTER — Ambulatory Visit: Admitting: Psychiatry
